# Patient Record
Sex: Female | Born: 1960 | Race: White | Hispanic: No | Marital: Married | Smoking: Never smoker
Health system: Southern US, Community
[De-identification: ages and names within clinical notes are randomized; demographics above are authoritative.]

## PROBLEM LIST (undated history)

## (undated) DIAGNOSIS — R079 Chest pain, unspecified: Secondary | ICD-10-CM

## (undated) DIAGNOSIS — Z91018 Allergy to other foods: Secondary | ICD-10-CM

## (undated) DIAGNOSIS — I341 Nonrheumatic mitral (valve) prolapse: Secondary | ICD-10-CM

## (undated) DIAGNOSIS — I10 Essential (primary) hypertension: Secondary | ICD-10-CM

## (undated) HISTORY — DX: Allergy to other foods: Z91.018

## (undated) HISTORY — DX: Nonrheumatic mitral (valve) prolapse: I34.1

## (undated) HISTORY — DX: Essential (primary) hypertension: I10

---
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
## 1999-09-24 ENCOUNTER — Ambulatory Visit (HOSPITAL_BASED_OUTPATIENT_CLINIC_OR_DEPARTMENT_OTHER): Admission: RE | Admit: 1999-09-24 | Discharge: 1999-09-25 | Payer: Self-pay | Admitting: Orthopaedic Surgery

## 2001-01-13 ENCOUNTER — Ambulatory Visit (HOSPITAL_COMMUNITY): Admission: RE | Admit: 2001-01-13 | Discharge: 2001-01-13 | Payer: Self-pay | Admitting: Obstetrics and Gynecology

## 2001-01-13 ENCOUNTER — Encounter (INDEPENDENT_AMBULATORY_CARE_PROVIDER_SITE_OTHER): Payer: Self-pay | Admitting: Specialist

## 2001-06-06 ENCOUNTER — Inpatient Hospital Stay (HOSPITAL_COMMUNITY): Admission: AD | Admit: 2001-06-06 | Discharge: 2001-06-06 | Payer: Self-pay | Admitting: Obstetrics and Gynecology

## 2001-09-21 ENCOUNTER — Ambulatory Visit (HOSPITAL_COMMUNITY): Admission: RE | Admit: 2001-09-21 | Discharge: 2001-09-21 | Payer: Self-pay | Admitting: Obstetrics and Gynecology

## 2001-11-09 ENCOUNTER — Inpatient Hospital Stay (HOSPITAL_COMMUNITY): Admission: AD | Admit: 2001-11-09 | Discharge: 2001-11-11 | Payer: Self-pay | Admitting: Obstetrics and Gynecology

## 2001-12-14 ENCOUNTER — Other Ambulatory Visit: Admission: RE | Admit: 2001-12-14 | Discharge: 2001-12-14 | Payer: Self-pay | Admitting: *Deleted

## 2004-08-14 ENCOUNTER — Encounter: Admission: RE | Admit: 2004-08-14 | Discharge: 2004-08-14 | Payer: Self-pay | Admitting: Family Medicine

## 2006-04-06 ENCOUNTER — Ambulatory Visit: Admission: RE | Admit: 2006-04-06 | Discharge: 2006-04-06 | Payer: Self-pay | Admitting: Family Medicine

## 2007-06-19 ENCOUNTER — Encounter: Admission: RE | Admit: 2007-06-19 | Discharge: 2007-06-19 | Payer: Self-pay | Admitting: Obstetrics and Gynecology

## 2007-11-16 ENCOUNTER — Encounter: Admission: RE | Admit: 2007-11-16 | Discharge: 2007-11-16 | Payer: Self-pay | Admitting: Obstetrics and Gynecology

## 2009-01-13 ENCOUNTER — Ambulatory Visit (HOSPITAL_COMMUNITY): Admission: RE | Admit: 2009-01-13 | Discharge: 2009-01-13 | Payer: Self-pay | Admitting: Obstetrics and Gynecology

## 2009-01-13 ENCOUNTER — Encounter: Admission: RE | Admit: 2009-01-13 | Discharge: 2009-01-13 | Payer: Self-pay | Admitting: Obstetrics and Gynecology

## 2010-01-19 ENCOUNTER — Encounter: Admission: RE | Admit: 2010-01-19 | Discharge: 2010-01-19 | Payer: Self-pay | Admitting: Obstetrics and Gynecology

## 2010-07-20 ENCOUNTER — Encounter: Payer: Self-pay | Admitting: Obstetrics and Gynecology

## 2010-11-13 NOTE — Op Note (Signed)
[facility]  Patient:    Pamela Poole, Pamela Poole                     MRN: [id number] Proc. Date: 01/13/01 Adm. Date:  [id number] Attending:  Lendon Colonel                           Operative Report  PREOPERATIVE DIAGNOSES:  Infertility and profuse periods.  POSTOPERATIVE DIAGNOSES:  Endometrial polyps and subserosal fibroids.  OPERATION:  Hysteroscopy with resection of endometrial polyps.  Laparoscopy with Choron intubation.  DESCRIPTION OF PROCEDURE:  The patient was placed in lithotomy position and prepped and draped in the usual fashion.  Bladder was emptied.  A transverse incision was made in the umbilicus, and the abdomen was distended with Veress needle under low pressure.  Aspiration infusion technique was utilized.  The trocar was then inserted into the abdomen, and visualization of the pelvis was accomplished.  The Kahn cannula had been placed into the cervix.  The uterus was mobile, quite normal in size.  There were two external serosal fibroids on the left side of the uterus.  Both ovaries were normal.  Indigo carmine was seen to come out of both fallopian tubes quite immediately.  Both fallopian tubes appeared normal in size, and the fimbria likewise appeared normal.  The only finding that was of no significance was there was a high attachment of the sigmoid colon on the left pelvic sidewall.  This did not interfere with tubal position at all.  Both ovaries were normal.  There was no cul-de-sac or anterior evidence of endometriosis.  The upper abdomen appeared normal.  The gas was then evacuated.  The skin closed with 4-0 PDS.  Then the incisions were infiltrated with 0.5% Marcaine with epinephrine.  I did a second puncture down low for manipulation using a 5 mm trocar.  We then went down below and did a careful dilatation of the cervix and visualized the endometrial cavity. The endometrial cavity was smooth except for two small polyps  which were resected without difficulty.  All of the polyps and resected material was sent to the lab for study.  Pamela Poole tolerated this procedure well and was sent to the recovery room in good condition. DD:  01/13/01 TD:  01/13/01 Job: [id number]

## 2010-11-13 NOTE — Op Note (Signed)
[place]. [facility]  Patient:    Pamela Poole, Pamela Poole                       MRN: [id number] Proc. Date: 09/24/99 Adm. Date:  [id number] Attending:  Marcene Corning                           Operative Report  PREOPERATIVE DIAGNOSES: 1. Right knee anterior cruciate ligament tear. 2. Right knee torn medial meniscus.  POSTOPERATIVE DIAGNOSES: 1. Right knee anterior cruciate ligament tear. 2. Right knee torn lateral meniscus.  PROCEDURES: 1. Right knee ACL reconstruction. 2. Right knee partial lateral meniscectomy.  ANESTHESIA:  General.  ATTENDING SURGEON:  Lubertha Basque. Jerl Santos, M.D.  ASSISTANT:  Prince Rome, P.A.  INDICATIONS FOR PROCEDURE:  The patient is a 50 year old woman who injured her nee at work several weeks ago.  Since that time, she has been unable to extend her nee fully and has been left with a painful swollen knee.  Exam was consistent with acute ACL rupture with lack of extension consistent with torn meniscus or interposed ACL stump.  The planned procedure at this point was for operative intervention to consist of partial meniscectomy and an ACL reconstruction with allograft material.  INFORMED CONSENT:  The procedure was discussed with the patient, and informed operative consent was obtained after discussion of the possible complications of, reaction to anesthesia, infection, and knee stiffness.  DESCRIPTION OF PROCEDURE:  The patient was taken to the operating suite, where general anesthetic was applied without difficulty.  She was positioned supine, nd prepped and draped in the normal sterile fashion.  After administration of preoperative IV antibiotics, an arthroscopy of the right knee was performed through two inferior portals.  The suprapatellar pouch was benign, as was the patellofemoral joint.  The medial compartment exhibited no evidence of meniscal or articular cartilage injury.  The notch was notable for a completely  torn ACL with the inferior stump stuck in an anterior position blocking the extension.  The PCL was intact.  The lateral compartment exhibited a small radial tear at the posterior horn of the lateral meniscus.  This was addressed with a partial lateral meniscectomy, taking about 10% of the structure back to a stable rim.  There were no degenerative changes in the lateral compartment.  The torn ACL was removed, followed by an extensive notchplasty done with an arthroscopic bur.  On the back table, Prince Rome crafted an allograft, which was fully defrosted to fit through 9 and 10 mm tunnels.  He placed drill holes in each of the bone plugs, placed PDS sutures in one set of drill holes, and a wire in one of the other drill holes.  A tibial guide was then placed into the knee, and through a separate incision, on the anterior aspect of the tibia, a guide wire was placed, entering the knee just anterior to the PCL.  A reamer was placed over this guide wire to  create a 10 mm tunnel up against the PCL.  A transtibial guide was then placed n the over-the-top position, and a second guide wire was placed through the tibial tunnel using this guide, into the femur, and out the proximal thigh.  Using this, the distal femur was over-reamed to a depth of 30 mm with a 9 mm reamer, leaving a 2 mm posterior wall clearly visualized.  A ______ resector was placed in the knee  to remove bony and cartilaginous debris.  The aforementioned graft was then pulled into position through the tibial tunnel and into the femoral tunnel.  Care was taken to make sure that the tendinous surface of the graft was left facing in a  posterior direction.  The knee was then hyperflexed, followed by placement of a  guide wire through the medial portal in the anterior position in the femoral tunnel.  An 8 x 25 headed interference screw was then placed over this guide wire into the tunnel.  This secured the  femoral end of the graft very well.  The knee was then ranged fully, and the graft was found to tighten a millimeter or two towards full extension.  A second guide wire was placed through the tibial tunnel and seen to enter the knee arthroscopically.  An 8 x 25 headed interference screw was placed ______ .  The fixation was not felt to be adequate, and this was removed, followed by placement of a 9 x 25 interference screw with better bite achieved.  The knee then ranged fully and came to full extension without any impingement.  The graft felt taught.  The knee was thoroughly irrigated followed by removal of instruments.  A simple suture of nylon was placed in the anterior incision, followed by Adaptic on this incision and the portals.  Dry gauze was hen applied, followed by an Ace wrap.  Estimated blood loss and intraoperative fluids can be obtained from anesthesia records.  No tourniquet was inflated.  DISPOSITION:  The patient was extubated in the operating room and taken to the recovery room in stable condition.  The plan was for her to stay overnight with the possibility that she might go home same day.  FOLLOWUP:  She will definitely follow up in the office in less than a week. DD:  09/24/99 TD:  09/24/99 Job: [id number]

## 2010-11-19 ENCOUNTER — Inpatient Hospital Stay (INDEPENDENT_AMBULATORY_CARE_PROVIDER_SITE_OTHER)
Admission: RE | Admit: 2010-11-19 | Discharge: 2010-11-19 | Disposition: A | Discharge status: Home / Self Care | Payer: Worker's Compensation | Source: Ambulatory Visit | Attending: Emergency Medicine | Admitting: Emergency Medicine

## 2010-11-19 DIAGNOSIS — M67919 Unspecified disorder of synovium and tendon, unspecified shoulder: Secondary | ICD-10-CM

## 2010-12-18 ENCOUNTER — Other Ambulatory Visit: Payer: Self-pay | Admitting: Family Medicine

## 2010-12-18 DIAGNOSIS — Z1231 Encounter for screening mammogram for malignant neoplasm of breast: Secondary | ICD-10-CM

## 2011-02-05 ENCOUNTER — Ambulatory Visit
Admission: RE | Admit: 2011-02-05 | Discharge: 2011-02-05 | Disposition: A | Discharge status: Home / Self Care | Payer: BC Managed Care – PPO | Source: Ambulatory Visit | Attending: Family Medicine | Admitting: Family Medicine

## 2011-02-05 DIAGNOSIS — Z1231 Encounter for screening mammogram for malignant neoplasm of breast: Secondary | ICD-10-CM

## 2011-12-21 ENCOUNTER — Other Ambulatory Visit: Payer: Self-pay | Admitting: Family Medicine

## 2011-12-21 ENCOUNTER — Other Ambulatory Visit: Payer: Self-pay | Admitting: Obstetrics and Gynecology

## 2011-12-21 DIAGNOSIS — Z1231 Encounter for screening mammogram for malignant neoplasm of breast: Secondary | ICD-10-CM

## 2012-02-07 ENCOUNTER — Ambulatory Visit: Payer: BC Managed Care – PPO

## 2012-02-08 ENCOUNTER — Ambulatory Visit: Payer: BC Managed Care – PPO

## 2012-02-09 ENCOUNTER — Ambulatory Visit: Payer: BC Managed Care – PPO

## 2012-02-21 ENCOUNTER — Ambulatory Visit
Admission: RE | Admit: 2012-02-21 | Discharge: 2012-02-21 | Disposition: A | Discharge status: Home / Self Care | Payer: BC Managed Care – PPO | Source: Ambulatory Visit | Attending: Obstetrics and Gynecology | Admitting: Obstetrics and Gynecology

## 2012-02-21 DIAGNOSIS — Z1231 Encounter for screening mammogram for malignant neoplasm of breast: Secondary | ICD-10-CM

## 2012-12-19 ENCOUNTER — Other Ambulatory Visit: Payer: Self-pay

## 2012-12-19 DIAGNOSIS — Z1231 Encounter for screening mammogram for malignant neoplasm of breast: Secondary | ICD-10-CM

## 2013-01-16 ENCOUNTER — Ambulatory Visit: Payer: Self-pay

## 2013-01-29 ENCOUNTER — Ambulatory Visit
Admission: RE | Admit: 2013-01-29 | Discharge: 2013-01-29 | Disposition: A | Discharge status: Home / Self Care | Payer: 59 | Source: Ambulatory Visit

## 2013-01-29 DIAGNOSIS — Z1231 Encounter for screening mammogram for malignant neoplasm of breast: Secondary | ICD-10-CM

## 2013-04-25 ENCOUNTER — Encounter: Payer: Self-pay | Admitting: Physician Assistant

## 2013-04-25 ENCOUNTER — Ambulatory Visit (INDEPENDENT_AMBULATORY_CARE_PROVIDER_SITE_OTHER): Payer: 59 | Admitting: Physician Assistant

## 2013-04-25 DIAGNOSIS — T781XXA Other adverse food reactions, not elsewhere classified, initial encounter: Secondary | ICD-10-CM

## 2013-04-25 DIAGNOSIS — H6690 Otitis media, unspecified, unspecified ear: Secondary | ICD-10-CM

## 2013-04-25 DIAGNOSIS — Z91018 Allergy to other foods: Secondary | ICD-10-CM

## 2013-04-25 HISTORY — DX: Allergy to other foods: Z91.018

## 2013-04-25 MED ORDER — EPINEPHRINE 0.3 MG/0.3ML IJ SOAJ: 0.3000 mg | Freq: Once | INTRAMUSCULAR | Status: AC

## 2013-04-25 MED ORDER — AMOXICILLIN-POT CLAVULANATE 875-125 MG PO TABS: 1.0000 | ORAL_TABLET | Freq: Two times a day (BID) | ORAL | Status: DC

## 2013-04-25 NOTE — Progress Notes (Signed)
Patient ID: Pamela Poole MRN: [id number], DOB: [birth year], 52 y.o. Date of Encounter: 04/25/2013, 4:26 PM    Chief Complaint:  Chief Complaint  Patient presents with  . c/o rt ear ache     HPI: 52 y.o. 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 Says that her symptoms started last week with some achiness in her right ear. The pain has gotten much worse over the last 2 or 3 days. As well over the last couple days the left ear is also feeling achy and she has had some mild sore throat. This morning in the shower she blew her nose and felt a popping sensation in her ear. Has not been blowing much from her nose however. Has had no cough or chest congestion. No fevers or chills.  Home Meds: See attached medication section for any medications that were entered at today's visit. The computer does not put those onto this list.The following list is a list of meds entered prior to today's visit.   No current outpatient prescriptions on file prior to visit.   No current facility-administered medications on file prior to visit.    Allergies: No Known Allergies    Review of Systems: See HPI for pertinent ROS. All other ROS negative.    Physical Exam: Blood pressure 128/86, pulse 56, temperature 98.2 F (36.8 C), temperature source Oral, resp. rate 18, weight 175 lb (79.379 kg)., There is no height on file to calculate BMI. General: WNWD WF. Appears in no acute distress. HEENT: Normocephalic, atraumatic, eyes without discharge, sclera non-icteric, nares are without discharge. Bilateral auditory canals clear with no erythema, edema, or pain.  Both of her tympanic membranes are very dull and have an area of red erythema towards the center. Oral cavity moist, posterior pharynx without exudate, erythema, peritonsillar abscess, or post nasal drip.  Neck: Supple. No thyromegaly. No lymphadenopathy. Lungs: Clear bilaterally to auscultation without wheezes,  rales, or rhonchi. Breathing is unlabored. Heart: Regular rhythm. No murmurs, rubs, or gallops. Msk:  Strength and tone normal for age. Extremities/Skin: Warm and dry. No clubbing or cyanosis. No edema. No rashes or suspicious lesions. Neuro: Alert and oriented X 3. Moves all extremities spontaneously. Gait is normal. CNII-XII grossly in tact. Psych:  Responds to questions appropriately with a normal affect.     ASSESSMENT AND PLAN:  52 y.o. year old female with  1. Otitis media, bilateral - amoxicillin-clavulanate (AUGMENTIN) 875-125 MG per tablet; Take 1 tablet by mouth 2 (two) times daily.  Dispense: 20 tablet; Refill: 0 Complete all of the antibiotic. Recommend taking a decongestant. Followup if symptoms do not resolve.  Reviewed effect that she has no known drug allergies she made the comment that she recently has had allergic reaction to apples and cherries. Says that she never had any problems eating these foods until recently. Also reports that she's never had any allergic reaction to any other foods or products. Says that when she eats certain types of apples and when she eats cherries she developed an itchy sensation in her throat. I discussed allergy testing but she defers this at this time. I recommended she have an EpiPen available in case she needs it. Discussed indications of use including swelling and tightness and closing of the throat or difficulty breathing. 2. Food allergy, initial encounter - EPINEPHrine (EPI-PEN) 0.3 mg/0.3 mL SOAJ injection; Inject 0.3 mLs (0.3 mg total) into the muscle once.  Dispense: 1 Device; Refill: 1  [address] [place], [place], [facility] 04/25/2013 4:26 PM

## 2013-07-12 ENCOUNTER — Ambulatory Visit: Payer: 59 | Admitting: Physician Assistant

## 2013-07-16 ENCOUNTER — Ambulatory Visit: Payer: 59 | Admitting: Physician Assistant

## 2013-09-02 ENCOUNTER — Ambulatory Visit (INDEPENDENT_AMBULATORY_CARE_PROVIDER_SITE_OTHER): Payer: 59 | Admitting: Internal Medicine

## 2013-09-02 VITALS — BP 140/80 | HR 64 | Temp 98.6°F | Resp 16 | Ht 71.5 in | Wt 174.0 lb

## 2013-09-02 DIAGNOSIS — J301 Allergic rhinitis due to pollen: Secondary | ICD-10-CM

## 2013-09-02 DIAGNOSIS — J309 Allergic rhinitis, unspecified: Secondary | ICD-10-CM

## 2013-09-02 DIAGNOSIS — J019 Acute sinusitis, unspecified: Secondary | ICD-10-CM

## 2013-09-02 DIAGNOSIS — J329 Chronic sinusitis, unspecified: Secondary | ICD-10-CM

## 2013-09-02 MED ORDER — AMOXICILLIN 500 MG PO CAPS: 1000.0000 mg | ORAL_CAPSULE | Freq: Two times a day (BID) | ORAL | Status: AC

## 2013-09-02 MED ORDER — FLUTICASONE PROPIONATE 50 MCG/ACT NA SUSP: NASAL | Status: DC

## 2013-09-02 NOTE — Progress Notes (Signed)
   Subjective:    Patient ID: Evorn GongSusan D Hilbun, female    DOB: 04/07/1961, 53 y.o.   MRN: [id number] This chart was scribed for Ellamae Siaobert Shlok Raz, MD by Nicholos Johnsenise Iheanachor, Medical Scribe. This patient's care was started at 3:24 PM.  Sinus Problem Associated symptoms include ear pain and sinus pressure.   HPI Comments: Evorn GongSusan D Lukehart is a 53 y.o. female who presents to the [facility] complaining of ear pain, green productive rhinorrhea and sinus pressure that has been intermittent since the beginning of the winter. Will occasionally have swollen, red, itchy eyes. Also states she will wake up in the morning regularly and just feel stopped up.vBeen back and forth to the doctor to treat symptoms but states they always return. Has been treated with a Z-Pak before that helped to resolve symptoms but only temporarily.  Attempts to treat with hot steam showers which provide some relief. Pt states they recently purchased a brand new oak wood stove.  Review of Systems  HENT: Positive for ear pain, rhinorrhea and sinus pressure.   Eyes: Negative for redness and itching.   Objective:  Physical Exam  Vitals reviewed. Constitutional: She is oriented to person, place, and time. She appears well-developed and well-nourished. No distress.  HENT:  Head: Normocephalic and atraumatic.  Right Ear: External ear normal.  Left Ear: External ear normal.  Nose: Nose normal.  Mouth/Throat: Oropharynx is clear and moist.  Nose has purulent discharge.   Eyes: Conjunctivae and EOM are normal. Pupils are equal, round, and reactive to light.  Right tm dark. Left tm clear.   Neck: Neck supple.  Cardiovascular: Normal rate.   Pulmonary/Chest: Effort normal. No respiratory distress.  Musculoskeletal: Normal range of motion.  Lymphadenopathy:    She has no cervical adenopathy.  Neurological: She is alert and oriented to person, place, and time.  Skin: Skin is warm and dry.  Psychiatric: She has a  normal mood and affect. Her behavior is normal.   Assessment & Plan:   I have completed the patient encounter in its entirety as documented by the scribe, with editing by me where necessary. Regan Mcbryar P. Merla Richesoolittle, M.D.  Problem #1 chronic sinus infection secondary to household allergens-suspicious about smoke from wood stove with [place] Meds ordered this encounter  Medications  . amoxicillin (AMOXIL) 500 MG capsule    Sig: Take 2 capsules (1,000 mg total) by mouth 2 (two) times daily.    Dispense:  40 capsule    Refill:  0  . fluticasone (FLONASE) 50 MCG/ACT nasal spray    Sig: 2 sprays each nostril twice a day for the first week and then one spray each nostril twice a day for 3 months    Dispense:  16 g    Refill:  6

## 2013-10-25 ENCOUNTER — Ambulatory Visit (INDEPENDENT_AMBULATORY_CARE_PROVIDER_SITE_OTHER): Payer: 59 | Admitting: Physician Assistant

## 2013-10-25 ENCOUNTER — Encounter: Payer: Self-pay | Admitting: Physician Assistant

## 2013-10-25 ENCOUNTER — Ambulatory Visit
Admission: RE | Admit: 2013-10-25 | Discharge: 2013-10-25 | Disposition: A | Discharge status: Home / Self Care | Payer: 59 | Source: Ambulatory Visit | Attending: Physician Assistant | Admitting: Physician Assistant

## 2013-10-25 VITALS — BP 136/76 | HR 72 | Temp 98.3°F | Resp 18 | Wt 173.0 lb

## 2013-10-25 DIAGNOSIS — R222 Localized swelling, mass and lump, trunk: Secondary | ICD-10-CM

## 2013-10-25 NOTE — Progress Notes (Signed)
    Patient ID: Pamela GongSusan D Kackley MRN: [id number], DOB: 02/21/1961, 53 y.o. Date of Encounter: 10/25/2013, 3:04 PM    Chief Complaint:  Chief Complaint  Patient presents with  . 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 Says that just 5 days ago she noticed that there was a firm area / knot that was protruding from her chest. Says  she knows she would've noticed it before if it had been like that in the past. Says it is not sore or tender or painful.     Home Meds: See attached medication section for any medications that were entered at today's visit. The computer does not put those onto this list.The following list is a list of meds entered prior to today's visit.   Current Outpatient Prescriptions on File Prior to Visit  Medication Sig Dispense Refill  . EPINEPHrine (EPI-PEN) 0.3 mg/0.3 mL SOAJ injection Inject 0.3 mLs (0.3 mg total) into the muscle once.  1 Device  1  . fluticasone (FLONASE) 50 MCG/ACT nasal spray 2 sprays each nostril twice a day for the first week and then one spray each nostril twice a day for 3 months  16 g  6  . MULTIPLE VITAMINS PO Take 1 tablet by mouth daily.       No current facility-administered medications on file prior to visit.    Allergies: No Known Allergies    Review of Systems: See HPI for pertinent ROS. All other ROS negative.    Physical Exam: Blood pressure 136/76, pulse 72, temperature 98.3 F (36.8 C), temperature source Oral, resp. rate 18, weight 173 lb (78.472 kg)., Body mass index is 23.79 kg/(m^2). General:  WNWD WF. Appears in no acute distress. Neck: Supple. No thyromegaly. No lymphadenopathy. Chest:  With inspection of the chest, there is visible protrusion at the right upper chest. There is not a symmetrical protrusion at the same position on the left upper chest. With Palpation: The area of the protrusion is very firm with  palpation.  It seems to be present at the medial edge of the clavicle.  Lungs: Clear bilaterally to auscultation without wheezes, rales, or rhonchi. Breathing is unlabored. Heart: Regular rhythm. No murmurs, rubs, or gallops. Msk:  Strength and tone normal for age. Extremities/Skin: Warm and dry. Neuro: Alert and oriented X 3. Moves all extremities spontaneously. Gait is normal. CNII-XII grossly in tact. Psych:  Responds to questions appropriately with a normal affect.     ASSESSMENT AND PLAN:  53 y.o. year old female with  1. Mass of right chest wall Will start by Obtaining  x-ray to determine whether this protrusion is bone. --Will then determine whether it is part of the clavicle or whether it is abnormal bone growth (ie OsteoSarcoma). Or, if it is not bone at all, then will decide if needs biopsy by surgeon etc.  Obtain chest x-ray results then can make further decisions regarding further evaluation and treatment. - DG Chest 2 View; Future   Signed, Shon HaleMary Beth CohoeDixon, [place], [facility] 10/25/2013 3:04 PM

## 2014-05-03 ENCOUNTER — Telehealth: Payer: Self-pay | Admitting: Physician Assistant

## 2014-05-03 NOTE — Telephone Encounter (Signed)
Patient is calling to see if she could get a referral to an allergist if possible  [phone]

## 2014-05-14 NOTE — Telephone Encounter (Signed)
Pt called insurance and does not need referral.  She has made her own appt.

## 2014-08-29 ENCOUNTER — Encounter: Payer: Self-pay | Admitting: Physician Assistant

## 2014-08-29 ENCOUNTER — Ambulatory Visit (INDEPENDENT_AMBULATORY_CARE_PROVIDER_SITE_OTHER): Payer: PRIVATE HEALTH INSURANCE | Admitting: Physician Assistant

## 2014-08-29 VITALS — BP 114/72 | HR 76 | Temp 100.1°F | Resp 18 | Wt 173.0 lb

## 2014-08-29 DIAGNOSIS — R509 Fever, unspecified: Secondary | ICD-10-CM | POA: Diagnosis not present

## 2014-08-29 DIAGNOSIS — J101 Influenza due to other identified influenza virus with other respiratory manifestations: Secondary | ICD-10-CM

## 2014-08-29 LAB — INFLUENZA A AND B
Inflenza A Ag: POSITIVE — AB
Influenza B Ag: NEGATIVE

## 2014-08-29 MED ORDER — OSELTAMIVIR PHOSPHATE 75 MG PO CAPS: 75.0000 mg | ORAL_CAPSULE | Freq: Two times a day (BID) | ORAL | Status: DC

## 2014-08-29 NOTE — Progress Notes (Signed)
Patient ID: Pamela Poole MRN: [id number], DOB: 05/12/1961, 54 y.o. Date of Encounter: 08/29/2014, 3:33 PM    Chief Complaint:  Chief Complaint  Patient presents with  . sick x 2 days    fever,chills, body aches, congestion     HPI: 54 y.o. year old white female says that she started getting sick Tuesday night 08/27/14. Having some head congestion now it's in her chest as well today. Will some pressure in her ears as well. Says that she has had really bad body aches and chills--" the worst she has ever had".  Temperature here is 100.1 and seizures says that this is after taking aspirin and DayQuil. Teaches third grade. No one in her household is sick.     Home Meds:   Outpatient Prescriptions Prior to Visit  Medication Sig Dispense Refill  . MULTIPLE VITAMINS PO Take 1 tablet by mouth daily.    Marland Kitchen. RETIN-A MICRO 0.04 % gel From Dermatologist    . EPINEPHrine (EPI-PEN) 0.3 mg/0.3 mL SOAJ injection Inject 0.3 mLs (0.3 mg total) into the muscle once. 1 Device 1  . fluticasone (FLONASE) 50 MCG/ACT nasal spray 2 sprays each nostril twice a day for the first week and then one spray each nostril twice a day for 3 months (Patient not taking: Reported on 08/29/2014) 16 g 6   No facility-administered medications prior to visit.    Allergies: No Known Allergies    Review of Systems: See HPI for pertinent ROS. All other ROS negative.    Physical Exam: Blood pressure 114/72, pulse 76, temperature 100.1 F (37.8 C), temperature source Oral, resp. rate 18, weight 173 lb (78.472 kg)., Body mass index is 23.79 kg/(m^2). General:  WNWD WF. Appears in no acute distress. HEENT: Normocephalic, atraumatic, eyes without discharge, sclera non-icteric, nares are without discharge. Bilateral auditory canals clear, TM's are without perforation, pearly grey and translucent with reflective cone of light bilaterally. Oral cavity moist, posterior pharynx without exudate, erythema, peritonsillar abscess. No  tenderness with percussion of frontal or maxillary sinuses bilaterally.  Neck: Supple. No thyromegaly. No lymphadenopathy. Lungs: Clear bilaterally to auscultation without wheezes, rales, or rhonchi. Breathing is unlabored. Heart: Regular rhythm. No murmurs, rubs, or gallops. Msk:  Strength and tone normal for age. Extremities/Skin: Warm and dry.  No rashes. Neuro: Alert and oriented X 3. Moves all extremities spontaneously. Gait is normal. CNII-XII grossly in tact. Psych:  Responds to questions appropriately with a normal affect.   Influenza test is positive for insulin influenza A. I reviewed the test results myself and it is definitely strongly positive.   ASSESSMENT AND PLAN:  54 y.o. year old female with  1. Influenza A Pt states that she did receive influenza vaccine. Instructed patient to start the Tamiflu immediately and take as directed and complete all of it. Can use over-the-counter Tylenol or Motrin as needed for body aches and fever. Also can use over-the-counter decongestants and cough medications as needed for symptom relief for these. At home she lives with her husband and her daughter. Neither of them have any symptoms of the flu so far. They are to take the prophylactic dose of Tamiflu. Patient is to stay out of work tomorrow which is a Friday and then stay home through this weekend so that she is not spreading germs. - oseltamivir (TAMIFLU) 75 MG capsule; Take 1 capsule (75 mg total) by mouth 2 (two) times daily.  Dispense: 10 capsule; Refill: 0  2. Fever and chills - Influenza  a and b   Murray Hodgkins Mullens, [place], [facility] 08/29/2014 3:33 PM

## 2014-11-22 ENCOUNTER — Other Ambulatory Visit: Payer: Self-pay | Admitting: Obstetrics and Gynecology

## 2014-11-22 DIAGNOSIS — R928 Other abnormal and inconclusive findings on diagnostic imaging of breast: Secondary | ICD-10-CM

## 2014-11-29 ENCOUNTER — Ambulatory Visit
Admission: RE | Admit: 2014-11-29 | Discharge: 2014-11-29 | Disposition: A | Discharge status: Home / Self Care | Payer: PRIVATE HEALTH INSURANCE | Source: Ambulatory Visit | Attending: Obstetrics and Gynecology | Admitting: Obstetrics and Gynecology

## 2014-11-29 DIAGNOSIS — R928 Other abnormal and inconclusive findings on diagnostic imaging of breast: Secondary | ICD-10-CM

## 2015-03-18 ENCOUNTER — Encounter: Payer: Self-pay | Admitting: Family Medicine

## 2015-03-18 ENCOUNTER — Ambulatory Visit (INDEPENDENT_AMBULATORY_CARE_PROVIDER_SITE_OTHER): Payer: PRIVATE HEALTH INSURANCE | Admitting: Family Medicine

## 2015-03-18 VITALS — BP 126/68 | HR 78 | Temp 100.2°F | Resp 16 | Wt 172.0 lb

## 2015-03-18 DIAGNOSIS — J019 Acute sinusitis, unspecified: Secondary | ICD-10-CM

## 2015-03-18 MED ORDER — FLUCONAZOLE 150 MG PO TABS: 150.0000 mg | ORAL_TABLET | Freq: Once | ORAL | Status: DC

## 2015-03-18 MED ORDER — AMOXICILLIN-POT CLAVULANATE 875-125 MG PO TABS: 1.0000 | ORAL_TABLET | Freq: Two times a day (BID) | ORAL | Status: DC

## 2015-03-18 NOTE — Progress Notes (Signed)
   Subjective:    Patient ID: Pamela Poole, female    DOB: 12/23/1960, 54 y.o.   MRN: [id number]  HPI  Patient reports 10 days of sinus pain and pressure in her maxillary and frontal sinuses bilaterally. She has had a fever to 101 and 102 all week long. Her teeth hurt. Her head hurts. She has postnasal drip. It all began with a viral upper respiratory infection however it is now turned into a sinus infection which is secondary in nature. She denies any cough. She denies any shortness of breath. She denies any chest pain. She does report significant fatigue Past Medical History  Diagnosis Date  . Multiple food allergies 04/25/2013    Apples, Cherries   No past surgical history on file. Current Outpatient Prescriptions on File Prior to Visit  Medication Sig Dispense Refill  . EPINEPHrine (EPI-PEN) 0.3 mg/0.3 mL SOAJ injection Inject 0.3 mLs (0.3 mg total) into the muscle once. 1 Device 1  . fluticasone (FLONASE) 50 MCG/ACT nasal spray 2 sprays each nostril twice a day for the first week and then one spray each nostril twice a day for 3 months 16 g 6  . MULTIPLE VITAMINS PO Take 1 tablet by mouth daily.    Marland Kitchen RETIN-A MICRO 0.04 % gel From Dermatologist     No current facility-administered medications on file prior to visit.   No Known Allergies Social History   Social History  . Marital Status: Married    Spouse Name: N/A  . Number of Children: N/A  . Years of Education: N/A   Occupational History  . Not on file.   Social History Main Topics  . Smoking status: Never Smoker   . Smokeless tobacco: Never Used  . Alcohol Use: Yes  . Drug Use: No  . Sexual Activity: Not on file   Other Topics Concern  . Not on file   Social History Narrative     Review of Systems  All other systems reviewed and are negative.      Objective:   Physical Exam  Constitutional: She appears well-developed and well-nourished.  HENT:  Right Ear: External ear normal.  Left Ear: External ear  normal.  Nose: Mucosal edema and rhinorrhea present. Right sinus exhibits maxillary sinus tenderness and frontal sinus tenderness. Left sinus exhibits maxillary sinus tenderness and frontal sinus tenderness.  Mouth/Throat: Oropharynx is clear and moist. No oropharyngeal exudate.  Neck: Neck supple.  Cardiovascular: Normal rate, regular rhythm and normal heart sounds.   Pulmonary/Chest: Effort normal and breath sounds normal. No respiratory distress. She has no wheezes. She has no rales.  Lymphadenopathy:    She has no cervical adenopathy.  Vitals reviewed.         Assessment & Plan:  Patient has secondary acute rhinosinusitis. Begin Augmentin 875 mg by mouth twice a day for 10 days. Also recommended the patient take a prednisone taper pack she was recently given at an urgent care. She can also use Diflucan 150 mg by mouth 1 if necessary should she develop a yeast infection

## 2015-08-26 ENCOUNTER — Telehealth: Payer: Self-pay | Admitting: Physician Assistant

## 2015-08-26 NOTE — Telephone Encounter (Signed)
Called pt, voicemail full.

## 2015-08-26 NOTE — Telephone Encounter (Signed)
Patient [place] questions about going out of the country and immunizations  [phone]

## 2015-08-28 NOTE — Telephone Encounter (Signed)
Voice mail box still full, left message on house phone.

## 2015-09-01 NOTE — Telephone Encounter (Signed)
Pt called back.  Given ph # [facility]  [phone].  Told still needs 3rd dose of Twinrix.

## 2015-10-31 ENCOUNTER — Encounter: Payer: Self-pay | Admitting: Internal Medicine

## 2015-11-21 ENCOUNTER — Ambulatory Visit (INDEPENDENT_AMBULATORY_CARE_PROVIDER_SITE_OTHER): Payer: PRIVATE HEALTH INSURANCE | Admitting: Internal Medicine

## 2015-11-21 DIAGNOSIS — Z7184 Encounter for health counseling related to travel: Secondary | ICD-10-CM

## 2015-11-21 DIAGNOSIS — Z7189 Other specified counseling: Secondary | ICD-10-CM

## 2015-11-21 DIAGNOSIS — Z23 Encounter for immunization: Secondary | ICD-10-CM

## 2015-11-21 DIAGNOSIS — Z789 Other specified health status: Secondary | ICD-10-CM

## 2015-11-21 MED ORDER — CIPROFLOXACIN HCL 500 MG PO TABS: 500.0000 mg | ORAL_TABLET | Freq: Two times a day (BID) | ORAL | Status: DC

## 2015-11-21 NOTE — Progress Notes (Signed)
Subjective:   Pamela Poole is a 55 y.o. female who presents to the Infectious Disease clinic for travel consultation. Planned departure date: June 27          Planned return date: July 13 Countries of travel: [place] Areas in country: rural   Accommodations: hostel Purpose of travel: field work Prior travel out of [place]: yes     Objective:   Medications: reviewed    Assessment:    No contraindications to travel. none     Plan:    Issues discussed: environmental concerns, freshwater swimming, future shots, jet lag, MVA safety, safe food/water, traveler's diarrhea, website/handouts for more information, what to do if ill upon return, what to do if ill while there and Yellow Fever. Immunizations recommended: Hepatitis A series, Hepatitis B and Typhoid (parenteral). Last of the series of hepatitis A and B.  Previously received twinrix. Malaria prophylaxis: not indicated Traveler's diarrhea prophylaxis: ciprofloxacin. Total duration of visit: 1 Hour. Total time spent on education, counseling, coordination of care: 30 Minutes.

## 2015-11-21 NOTE — Patient Instructions (Signed)
[facility]                [address], [address]                   [place], [place] [id number]                      Phone: [phone]                        Fax: [phone]   Planned departure date: June 27          Planned return date: July 13 Countries of travel: [place]   Guidelines for the Prevention & Treatment of Traveler's Diarrhea  Prevention: "Boil it, Peel it, [place] it, or Forget it"   the fewer chances -> lower risk: try to stick to food & water precautions as much as possible"   If it's "piping hot"; it is probably okay, if not, it may not be   Treatment   1) You should always take care to drink lots of fluids in order to avoid dehydration   2) You should bring medications with you in case you come down with a case of diarrhea   3) OTC = bring pepto-bismol - can take with initial abdominal symptoms;                    Imodium - can help slow down your intestinal tract, can help relief cramps                    and diarrhea, can take if no bloody diarrhea  Use ciprofloxacin if needed for traveler's diarrhea  Guidelines for the Prevention of Malaria  Avoidance:  -fewer mosquito bites = lower risk. Mosquitos can bite at night as well as daytime  -cover up (long sleeve clothing), mosquito nets, screens  -Insect repellent for your skin ( DEET containing lotion > 20%): for clothes ( permethrin spray)   Malaria prevention not indicated.   Immunizations received today: Hepatitis A series, Hepatitis B and Typhoid (parenteral)  Future immunizations, if indicated none indicated   Prior to travel:  1) Be sure to pick up appropriate prescriptions, including medicine you take daily. Do not expect to be able to fill your prescriptions abroad.  2) Strongly consider obtaining traveler's insurance, including emergency evacuation insurance. Most plans in the [place] do not cover participants abroad. (see below for resources)    3) Register at the appropriate [place] embassy or consulate with travel dates so they are aware of your presence in-country and for helpful advice during travel using the [employer] Traveler Enrollment Program (STEP, GuyGalaxy.sihttps://step.state.gov/step).  4) Leave contact information with a relative or friend.  5) Keep a Corporate treasurerphotocopy passport, credit cards in case they become lost or stolen  6) Inform your credit card company that you will be travelling abroad   During travel:  1) If you become ill and need medical advice, the U.S[place] WellPointembassy website of the country you are traveling in general provides a list of English speaking doctors.  We are also available on MyChart for remote consultation if you register prior to travel. 2) Avoid motorcycles or scooters when at all possible. Traffic laws in many countries are lax and accidents occur frequently.  3) Do not take any unnecessary risks that you wouldn't do at home.   Resources:  -  Country specific information: BlindResource.ca or GreenNylon.com.cy  -Press photographer (DEET, mosquito nets): [employer], [employer] store, [employer], [place] insurance options: gatewayplans.com; http://clayton-rivera.info/; travelguard.com or Good Pilgrim's Pride, gninsurance.com or info@gninsurance .com, [id number].   Post Travel:  If you return from your trip ill, call your primary care doctor or our travel clinic @ [phone].   Enjoy your trip and know that with proper pre-travel preparation, most people have an enjoyable and uninterrupted trip!

## 2015-12-19 ENCOUNTER — Telehealth: Payer: Self-pay | Admitting: *Deleted

## 2015-12-19 NOTE — Telephone Encounter (Signed)
Printed Immunization History for the patient to pick up Monday.  Pt did not need "yellow" immunization card due to not receiving Yellow Fever Vaccine at her Travel Clinic appointment.  Placed information in an envelope and taken to [employer] accordian file.

## 2015-12-19 NOTE — Telephone Encounter (Signed)
Patient called stating she lost her travel immunization record and leaves for a trip Monday evening 12/22/15. She will call back on Monday morning to speak to Micael HampshireMichelle Howell, RN travel nurse. Wendall MolaJacqueline Taejon Irani

## 2016-08-05 ENCOUNTER — Encounter: Payer: Self-pay | Admitting: Family Medicine

## 2016-08-05 ENCOUNTER — Ambulatory Visit: Payer: PRIVATE HEALTH INSURANCE | Admitting: Physician Assistant

## 2016-08-05 ENCOUNTER — Ambulatory Visit (INDEPENDENT_AMBULATORY_CARE_PROVIDER_SITE_OTHER): Payer: Managed Care, Other (non HMO) | Admitting: Family Medicine

## 2016-08-05 VITALS — BP 138/78 | HR 71 | Temp 98.3°F | Ht 69.5 in | Wt 184.0 lb

## 2016-08-05 DIAGNOSIS — Z7689 Persons encountering health services in other specified circumstances: Secondary | ICD-10-CM | POA: Diagnosis not present

## 2016-08-05 DIAGNOSIS — J324 Chronic pansinusitis: Secondary | ICD-10-CM

## 2016-08-05 MED ORDER — AMOXICILLIN-POT CLAVULANATE 875-125 MG PO TABS: 1.0000 | ORAL_TABLET | Freq: Two times a day (BID) | ORAL | 0 refills | Status: DC

## 2016-08-05 NOTE — Patient Instructions (Signed)
It was a pleasure meeting you today!  Your symptoms are most likely related to a viral illness however if symptoms do not improve in 48 to 72 hours, an antibiotic has been provide for you. Please drink plenty of water so that your urine is pale yellow or clear. Also, get plenty of rest, use tylenol or ibuprofen as needed for discomfort and follow up if symptoms do not improve in 3 to 4 days, worsen, or you develop a fever >101.   Sinusitis, Adult Sinusitis is soreness and inflammation of your sinuses. Sinuses are hollow spaces in the bones around your face. They are located:  Around your eyes.  In the middle of your forehead.  Behind your nose.  In your cheekbones. Your sinuses and nasal passages are lined with a stringy fluid (mucus). Mucus normally drains out of your sinuses. When your nasal tissues get inflamed or swollen, the mucus can get trapped or blocked so air cannot flow through your sinuses. This lets bacteria, viruses, and funguses grow, and that leads to infection. Follow these instructions at home: Medicines  Take, use, or apply over-the-counter and prescription medicines only as told by your doctor. These may include nasal sprays.  If you were prescribed an antibiotic medicine, take it as told by your doctor. Do not stop taking the antibiotic even if you start to feel better. Hydrate and Humidify  Drink enough water to keep your pee (urine) clear or pale yellow.  Use a cool mist humidifier to keep the humidity level in your home above 50%.  Breathe in steam for 10-15 minutes, 3-4 times a day or as told by your doctor. You can do this in the bathroom while a hot shower is running.  Try not to spend time in cool or dry air. Rest  Rest as much as possible.  Sleep with your head raised (elevated).  Make sure to get enough sleep each night. General instructions  Put a warm, moist washcloth on your face 3-4 times a day or as told by your doctor. This will help with  discomfort.  Wash your hands often with soap and water. If there is no soap and water, use hand sanitizer.  Do not smoke. Avoid being around people who are smoking (secondhand smoke).  Keep all follow-up visits as told by your doctor. This is important. Contact a doctor if:  You have a fever.  Your symptoms get worse.  Your symptoms do not get better within 10 days. Get help right away if:  You have a very bad headache.  You cannot stop throwing up (vomiting).  You have pain or swelling around your face or eyes.  You have trouble seeing.  You feel confused.  Your neck is stiff.  You have trouble breathing. This information is not intended to replace advice given to you by your health care provider. Make sure you discuss any questions you have with your health care provider. Document Released: 12/01/2007 Document Revised: 02/08/2016 Document Reviewed: 04/09/2015 Elsevier Interactive Patient Education  2017 [employer]

## 2016-08-05 NOTE — Progress Notes (Signed)
Pre visit review using our clinic review tool, if applicable. No additional management support is needed unless otherwise documented below in the visit note. 

## 2016-08-05 NOTE — Progress Notes (Signed)
Patient ID: Pamela Poole, female   DOB: 30-Oct-1960, 56 y.o.   MRN: [id number]  Patient presents to clinic today to establish care.  Acute Concerns:  Symptoms of fatigue, nausea that has improved, chills, myalgias, sneezing, coughing that is nonproductive, nasal congestion for one week. She reports that symptoms are worsening with significant sinus pressure/pain and nasal congestion. Associated rhinitis with clear drainage. She denies fever, sweats, SOB, sore throat, tooth pain, ear pain, and diarrhea. No history of asthma/bronchitis. 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 No recent antibiotic therapy. She is not a smoker but reports smoking in high school. No treatments have been tried at home. No alleviating or aggravating factors.      Health Maintenance: Dental --Twice yearly and is UTD Vision --Yearly Immunizations --Influenza UTD; Td 2012 Colonoscopy --UTD; Normal with 10 year follow up Mammogram --UTD; 6/18 PAP -- UTD; 6/18 Bone Density --Needed at next physical   Past Medical History:  Diagnosis Date  . Multiple food allergies 04/25/2013   Apples, Cherries    No past surgical history on file.  Current Outpatient Prescriptions on File Prior to Visit  Medication Sig Dispense Refill  . EPINEPHrine (EPI-PEN) 0.3 mg/0.3 mL SOAJ injection Inject 0.3 mLs (0.3 mg total) into the muscle once. 1 Device 1  . MULTIPLE VITAMINS PO Take 1 tablet by mouth daily.     No current facility-administered medications on file prior to visit.     No Known Allergies  No family history on file.  Social History   Social History  . Marital status: Married    Spouse name: N/A  . Number of children: N/A  . Years of education: N/A   Occupational History  . Not on file.   Social History Main Topics  . Smoking status: Never Smoker  . Smokeless tobacco: Never Used  . Alcohol use Yes  . Drug use: No  . Sexual activity: Not on file   Other Topics Concern  . Not on file    Social History Narrative  . No narrative on file    Review of Systems  Constitutional: Positive for chills and malaise/fatigue.  HENT: Positive for congestion, sinus pain and sore throat. Negative for ear pain.   Respiratory: Positive for cough. Negative for shortness of breath and wheezing.   Cardiovascular: Negative for chest pain and palpitations.  Musculoskeletal: Positive for myalgias.  Neurological: Negative for dizziness and headaches.    BP 138/78   Pulse 71   Temp 98.3 F (36.8 C)   Ht 5' 9.5" (1.765 m)   Wt 184 lb (83.5 kg)   SpO2 96%   BMI 26.78 kg/m   Physical Exam  Constitutional: She is oriented to person, place, and time and well-developed, well-nourished, and in no distress.  HENT:  Right Ear: Tympanic membrane normal.  Left Ear: Tympanic membrane normal.  Nose: Rhinorrhea present. Right sinus exhibits maxillary sinus tenderness and frontal sinus tenderness. Left sinus exhibits maxillary sinus tenderness and frontal sinus tenderness.  Mouth/Throat: Mucous membranes are normal. No oropharyngeal exudate or posterior oropharyngeal erythema.  Eyes: Pupils are equal, round, and reactive to light. No scleral icterus.  Neck: Neck supple.  Cardiovascular: Normal rate and regular rhythm.   Pulmonary/Chest: Effort normal and breath sounds normal. She has no wheezes.  Lymphadenopathy:    She has no cervical adenopathy.  Neurological: She is alert and oriented to person, place, and time.  Skin: Skin is warm and dry. No rash noted.    Assessment/Plan: 1.  Pansinusitis, unspecified chronicity Symptom duration and worsening symptoms support treatment. We also discussed the possibility that symptoms may be viral and she wanted to hold off on antibiotics for 48 to 72 hours. With duration of symptoms, a written prescription for an antibiotic has been provided. She will monitor symptoms and if no improvement start antibiotic. Advised patient on supportive measures:  Get  rest, drink plenty of fluids, and use tylenol or ibuprofen as needed for pain. Follow up if fever >101, if symptoms worsen or if symptoms are not improved in 3 days. Patient verbalizes understanding.    - amoxicillin-clavulanate (AUGMENTIN) 875-125 MG tablet; Take 1 tablet by mouth 2 (two) times daily.  Dispense: 20 tablet; Refill: 0  2. Encounter to establish care We reviewed the PMH, PSH, FH, SH, Meds and Allergies. -We provided refills for any medications we will prescribe as needed. -We addressed current concerns per orders and patient instructions. -We have asked for records for pertinent exams, studies, vaccines and notes from previous providers. -We have advised patient to follow up per instructions below.   -Patient advised to return or notify a provider immediately if symptoms worsen or persist or new concerns arise.   She will schedule a CPE and lab work in the next 6 months.  Roddie McJulia Julez Huseby, FNP-C

## 2019-01-15 ENCOUNTER — Other Ambulatory Visit: Payer: Self-pay

## 2019-01-15 DIAGNOSIS — Z20822 Contact with and (suspected) exposure to covid-19: Secondary | ICD-10-CM

## 2019-01-18 ENCOUNTER — Telehealth: Payer: Self-pay | Admitting: Hematology

## 2019-01-18 LAB — NOVEL CORONAVIRUS, NAA: SARS-CoV-2, NAA: NOT DETECTED

## 2019-01-18 NOTE — Telephone Encounter (Signed)
Pt is aware covid 19 test is negative °

## 2019-03-30 ENCOUNTER — Other Ambulatory Visit: Payer: Self-pay | Admitting: Obstetrics and Gynecology

## 2019-03-30 DIAGNOSIS — R928 Other abnormal and inconclusive findings on diagnostic imaging of breast: Secondary | ICD-10-CM

## 2019-04-04 ENCOUNTER — Other Ambulatory Visit: Payer: Self-pay

## 2019-04-04 ENCOUNTER — Ambulatory Visit
Admission: RE | Admit: 2019-04-04 | Discharge: 2019-04-04 | Disposition: A | Discharge status: Home / Self Care | Payer: Managed Care, Other (non HMO) | Source: Ambulatory Visit | Attending: Obstetrics and Gynecology | Admitting: Obstetrics and Gynecology

## 2019-04-04 DIAGNOSIS — R928 Other abnormal and inconclusive findings on diagnostic imaging of breast: Secondary | ICD-10-CM

## 2019-07-11 ENCOUNTER — Other Ambulatory Visit: Payer: Self-pay

## 2019-07-11 ENCOUNTER — Encounter: Payer: Self-pay | Admitting: Neurology

## 2019-07-11 ENCOUNTER — Ambulatory Visit: Payer: Managed Care, Other (non HMO) | Admitting: Neurology

## 2019-07-11 VITALS — BP 146/87 | HR 60 | Temp 97.3°F | Ht 71.0 in | Wt 177.3 lb

## 2019-07-11 DIAGNOSIS — R030 Elevated blood-pressure reading, without diagnosis of hypertension: Secondary | ICD-10-CM

## 2019-07-11 DIAGNOSIS — R351 Nocturia: Secondary | ICD-10-CM

## 2019-07-11 DIAGNOSIS — R0683 Snoring: Secondary | ICD-10-CM | POA: Diagnosis not present

## 2019-07-11 DIAGNOSIS — G4719 Other hypersomnia: Secondary | ICD-10-CM

## 2019-07-11 DIAGNOSIS — R519 Headache, unspecified: Secondary | ICD-10-CM

## 2019-07-11 DIAGNOSIS — Z820 Family history of epilepsy and other diseases of the nervous system: Secondary | ICD-10-CM | POA: Diagnosis not present

## 2019-07-11 NOTE — Progress Notes (Signed)
Subjective:    Patient ID: Pamela Poole is a 59 y.o. female.  HPI     Huston Foley, MD, PhD [facility] [address], [address] [address] [place], [place] [address]  Dear Pamela Poole,   I saw your patient, [name], upon your kind request in my sleep clinic today for initial consultation of her sleep disorder, in particular, concern for underlying obstructive sleep apnea.  The patient is unaccompanied today.  As you know, [name] is a 59 year old right-handed woman with an underlying medical history of seasonal and environmental allergies and otherwise benign medical history, who reports snoring for the past 2 years or so. She has had some recent blood pressure elevation, she is keeping a blood pressure log.  She has noted a tendency to have higher blood pressure values first thing in the morning.  She has experienced daytime somnolence in the past year or 2.  She has a family history of sleep apnea affecting her father and brother, her brother is on a CPAP machine, her father had surgery, brother also had surgery.  She herself had nasal septum surgery and turbinate reduction when she was in college and her early 24s.  She reports that her snoring can be loud per husband's report.  She does not wake up gasping for air.  She has woken up with a headache on occasion and does wake up to go to the bathroom once or twice per average night.  Bedtime is generally between 10 and 11, rise time around 5.  She lives with her husband and 23 year old daughter, she works full-time as a Runner, broadcasting/film/video.  She teaches third grade, she has 3 dogs and 1 cat in the household, 1 dog generally tends to be in their bedroom at night, she has a TV in the bedroom and tries to turn it off before falling asleep.  She is a side sleeper.  She has been using a retainer at night, she does not report any bruxism.  She does not have any telltale symptoms of restless leg syndrome or leg twitching at night but has  had leg cramps at night.  She is a non-smoker, smoked some in college, she drinks alcohol about 3 days out of the week in the form of wine, does not drink a significant amount of coffee, usually 1-1/2 cups of half calf coffee, no sodas.  Her Epworth sleepiness score is 10 out of 24, fatigue severity score is 42 out of 63.   Her Past Medical History Is Significant For: Past Medical History:  Diagnosis Date  . High blood pressure   . Multiple food allergies 04/25/2013   Apples, Cherries    Her Past Surgical History Is Significant For: History reviewed. No pertinent surgical history.  Her Family History Is Significant For: History reviewed. No pertinent family history.  Her Social History Is Significant For: Social History   Socioeconomic History  . Marital status: Married    Spouse name: Not on file  . Number of children: Not on file  . Years of education: Not on file  . Highest education level: Not on file  Occupational History  . Not on file  Tobacco Use  . Smoking status: Never Smoker  . Smokeless tobacco: Never Used  Substance and Sexual Activity  . Alcohol use: Yes  . Drug use: No  . Sexual activity: Not on file  Other Topics Concern  . Not on file  Social History Narrative  . Not on file  Social Determinants of Health   Financial Resource Strain:   . Difficulty of Paying Living Expenses: Not on file  Food Insecurity:   . Worried About Charity fundraiser in the Last Year: Not on file  . Ran Out of Food in the Last Year: Not on file  Transportation Needs:   . Lack of Transportation (Medical): Not on file  . Lack of Transportation (Non-Medical): Not on file  Physical Activity:   . Days of Exercise per Week: Not on file  . Minutes of Exercise per Session: Not on file  Stress:   . Feeling of Stress : Not on file  Social Connections:   . Frequency of Communication with Friends and Family: Not on file  . Frequency of Social Gatherings with Friends and Family:  Not on file  . Attends Religious Services: Not on file  . Active Member of Clubs or Organizations: Not on file  . Attends Archivist Meetings: Not on file  . Marital Status: Not on file    Her Allergies Are:  No Known Allergies:   Her Current Medications Are:  Outpatient Encounter Medications as of 07/11/2019  Medication Sig  . EPINEPHrine (EPI-PEN) 0.3 mg/0.3 mL SOAJ injection Inject 0.3 mLs (0.3 mg total) into the muscle once.  . MULTIPLE VITAMINS PO Take 1 tablet by mouth daily.  . [DISCONTINUED] amoxicillin-clavulanate (AUGMENTIN) 875-125 MG tablet Take 1 tablet by mouth 2 (two) times daily.   No facility-administered encounter medications on file as of 07/11/2019.  :  Review of Systems:  Out of a complete 14 point review of systems, all are reviewed and negative with the exception of these symptoms as listed below: Review of Systems  Neurological:       Sleep consult. No prior sleep study. Pt sts she is here to rule out OSA for cause of high BP.  Epworth Sleepiness Scale 0= would never doze 1= slight chance of dozing 2= moderate chance of dozing 3= high chance of dozing  Sitting and reading:2 Watching TV:3 Sitting inactive in a public place (ex. Theater or meeting):1 As a passenger in a car for an hour without a break:2 Lying down to rest in the afternoon:2 Sitting and talking to someone:0 Sitting quietly after lunch (no alcohol):0 In a car, while stopped in traffic:0 Total: 10     Objective:  Neurological Exam  Physical Exam Physical Examination:   Vitals:   07/11/19 1621  BP: (!) 146/87  Pulse: 60  Temp: (!) 97.3 F (36.3 C)    General Examination: The patient is a very pleasant 59 y.o. female in no acute distress. She appears well-developed and well-nourished and well groomed.   HEENT: Normocephalic, atraumatic, pupils are equal, round and reactive to light, extraocular tracking is good without limitation to gaze excursion or nystagmus  noted. Hearing is grossly intact. Face is symmetric with normal facial animation. Speech is clear with no dysarthria noted. There is no hypophonia. There is no lip, neck/head, jaw or voice tremor. Neck is supple with full range of passive and active motion. There are no carotid bruits on auscultation. Oropharynx exam reveals: mild mouth dryness, good dental hygiene and mild airway crowding, due to Small airway entry, Mallampati is class II, tonsils are small, tongue protrudes centrally in palate elevates symmetrically.  Neck circumference is 14 and three-quarter inches.  She has a mild overbite.  Nasal inspection reveals no significant septal deviation or inferior turbinate hypertrophy.  Chest: Clear to auscultation without wheezing, rhonchi  or crackles noted.  Heart: S1+S2+0, regular and normal without murmurs, rubs or gallops noted.   Abdomen: Soft, non-tender and non-distended with normal bowel sounds appreciated on auscultation.  Extremities: There is no pitting edema in the distal lower extremities bilaterally.   Skin: Warm and dry without trophic changes noted.   Musculoskeletal: exam reveals no obvious joint deformities, tenderness or joint swelling or erythema.   Neurologically:  Mental status: The patient is awake, alert and oriented in all 4 spheres. Her immediate and remote memory, attention, language skills and fund of knowledge are appropriate. There is no evidence of aphasia, agnosia, apraxia or anomia. Speech is clear with normal prosody and enunciation. Thought process is linear. Mood is normal and affect is normal.  Cranial nerves II - XII are as described above under HEENT exam.  Motor exam: Normal bulk, strength and tone is noted. There is no tremor, Fine motor skills and coordination: grossly intact.  Cerebellar testing: No dysmetria or intention tremor. There is no truncal or gait ataxia.  Sensory exam: intact to light touch in the upper and lower extremities.  Gait, station  and balance: She stands easily. No veering to one side is noted. No leaning to one side is noted. Posture is age-appropriate and stance is narrow based. Gait shows normal stride length and normal pace. No problems turning are noted.   Assessment and Plan:  In summary, CINNAMON MORENCY is a very pleasant 60 y.o.-year old female with an underlying medical history of seasonal and environmental allergies and otherwise benign medical history, whose history and physical exam are concerning for obstructive sleep apnea (OSA). I had a long chat with the patient about my findings and the diagnosis of OSA, its prognosis and treatment options. We talked about medical treatments, surgical interventions and non-pharmacological approaches. I explained in particular the risks and ramifications of untreated moderate to severe OSA, especially with respect to developing cardiovascular disease down the Road, including congestive heart failure, difficult to treat hypertension, cardiac arrhythmias, or stroke. Even type 2 diabetes has, in part, been linked to untreated OSA. Symptoms of untreated OSA include daytime sleepiness, memory problems, mood irritability and mood disorder such as depression and anxiety, lack of energy, as well as recurrent headaches, especially morning headaches. We talked about trying to maintain a healthy lifestyle in general, as well as the importance of weight control. We also talked about the importance of good sleep hygiene. I recommended the following at this time: sleep study.  I explained the sleep test procedure to the patient and also outlined possible surgical and non-surgical treatment options of OSA, including the use of a custom-made dental device (which would require a referral to a specialist dentist or oral surgeon), upper airway surgical options (which would involve a referral to an ENT surgeon). I also explained the CPAP treatment option to the patient, who indicated that she would be  willing to consider CPAP if the need arises. I explained the importance of being compliant with PAP treatment, not only for insurance purposes but primarily to improve Her symptoms, and for the patient's long term health benefit, including to reduce Her cardiovascular risks. I answered all her questions today and the patient was in agreement. I plan to see her back after the sleep study is completed and encouraged her to call with any interim questions, concerns, problems or updates.   Thank you very much for allowing me to participate in the care of this nice patient. If I can be of  any further assistance to you please do not hesitate to call me at [phone].  Sincerely,   Star Age, MD, PhD

## 2019-07-11 NOTE — Patient Instructions (Signed)
Thank you for choosing [facility] for your sleep related care! It was nice to meet you today! I appreciate that you entrust me with your sleep related healthcare concerns. I hope, I was able to address at least some of your concerns today, and that I can help you feel reassured and also get better.    Here is what we discussed today and what we came up with as our plan for you:    Based on your symptoms and your exam I believe you are at some risk for obstructive sleep apnea (aka OSA), and I think we should proceed with a sleep study to determine whether you do or do not have OSA and how severe it is. Even, if you have mild OSA, I may want you to consider treatment with CPAP, as treatment of even borderline or mild sleep apnea can result and improvement of symptoms such as sleep disruption, daytime sleepiness, nighttime bathroom breaks, restless leg symptoms, improvement of headache syndromes, even improved mood disorder.   Please remember, the long-term risks and ramifications of untreated moderate to severe obstructive sleep apnea are: increased Cardiovascular disease, including congestive heart failure, stroke, difficult to control hypertension, treatment resistant obesity, arrhythmias, especially irregular heartbeat commonly known as A. Fib. (atrial fibrillation); even type 2 diabetes has been linked to untreated OSA.   Sleep apnea can cause disruption of sleep and sleep deprivation in most cases, which, in turn, can cause recurrent headaches, problems with memory, mood, concentration, focus, and vigilance. Most people with untreated sleep apnea report excessive daytime sleepiness, which can affect their ability to drive. Please do not drive if you feel sleepy. Patients with sleep apnea developed difficulty initiating and maintaining sleep (aka insomnia).   Having sleep apnea may increase your risk for other sleep disorders, including involuntary behaviors sleep such as sleep  terrors, sleep talking, sleepwalking.    Having sleep apnea can also increase your risk for restless leg syndrome and leg movements at night.   Please note that untreated obstructive sleep apnea may carry additional perioperative morbidity. Patients with significant obstructive sleep apnea (typically, in the moderate to severe degree) should receive, if possible, perioperative PAP (positive airway pressure) therapy and the surgeons and particularly the anesthesiologists should be informed of the diagnosis and the severity of the sleep disordered breathing.   I will likely see you back after your sleep study to go over the test results and where to go from there. We will call you after your sleep study to advise about the results (most likely, you will hear from [place], my nurse) and to set up an appointment at the time, as necessary.    Our sleep lab administrative assistant will call you to schedule your sleep study and give you further instructions, regarding the check in process for the sleep study, arrival time, what to bring, when you can expect to leave after the study, etc., and to answer any other logistical questions you may have. If you don't hear back from her by about 2 weeks from now, please feel free to call her direct line at [phone] or you can call our general clinic number, or email [place] through My Chart.

## 2019-08-11 ENCOUNTER — Ambulatory Visit: Payer: Managed Care, Other (non HMO)

## 2019-08-23 ENCOUNTER — Ambulatory Visit: Payer: Self-pay

## 2019-08-25 ENCOUNTER — Ambulatory Visit: Payer: Managed Care, Other (non HMO) | Attending: Internal Medicine

## 2019-08-25 DIAGNOSIS — Z23 Encounter for immunization: Secondary | ICD-10-CM

## 2019-08-25 NOTE — Progress Notes (Signed)
   Covid-19 Vaccination Clinic  Name:  Pamela Poole    MRN: [id number] DOB: October 01, 1960  08/25/2019  [name] was observed post Covid-19 immunization for 15 minutes without incidence. She was provided with Vaccine Information Sheet and instruction to access the V-Safe system.   [name] was instructed to call 911 with any severe reactions post vaccine: Marland Kitchen Difficulty breathing  . Swelling of your face and throat  . A fast heartbeat  . A bad rash all over your body  . Dizziness and weakness    Immunizations Administered    Name Date Dose VIS Date Route   Pfizer COVID-19 Vaccine 08/25/2019 10:19 AM 0.3 mL 06/08/2019 Intramuscular   Manufacturer: [employer], [employer]   Lot: [id number]   NDC: [address]-2

## 2019-09-19 ENCOUNTER — Ambulatory Visit: Payer: Managed Care, Other (non HMO) | Attending: Internal Medicine

## 2019-09-19 DIAGNOSIS — Z23 Encounter for immunization: Secondary | ICD-10-CM

## 2019-09-19 NOTE — Progress Notes (Signed)
   Covid-19 Vaccination Clinic  Name:  Pamela Poole    MRN: [id number] DOB: 1960-11-21  09/19/2019  Pamela Poole was observed post Covid-19 immunization for 15 minutes without incident. She was provided with Vaccine Information Sheet and instruction to access the V-Safe system.   Pamela Poole was instructed to call 911 with any severe reactions post vaccine: Marland Kitchen Difficulty breathing  . Swelling of face and throat  . A fast heartbeat  . A bad rash all over body  . Dizziness and weakness   Immunizations Administered    Name Date Dose VIS Date Route   Pfizer COVID-19 Vaccine 09/19/2019 10:25 AM 0.3 mL 06/08/2019 Intramuscular   Manufacturer: [employer], [employer]   Lot: [id number]   NDC: [address]-8

## 2020-04-11 ENCOUNTER — Telehealth: Payer: Self-pay

## 2020-04-11 NOTE — Telephone Encounter (Signed)
Faxed new patient records to [employer] to the attention of [employer]. Patient is scheduled to see Dr. Mayford Knife on 05/08/20

## 2020-04-29 ENCOUNTER — Other Ambulatory Visit: Payer: Self-pay | Admitting: Obstetrics and Gynecology

## 2020-04-29 DIAGNOSIS — R928 Other abnormal and inconclusive findings on diagnostic imaging of breast: Secondary | ICD-10-CM

## 2020-05-08 ENCOUNTER — Encounter: Payer: Self-pay | Admitting: Cardiology

## 2020-05-08 ENCOUNTER — Other Ambulatory Visit: Payer: Self-pay

## 2020-05-08 ENCOUNTER — Ambulatory Visit: Payer: Managed Care, Other (non HMO) | Admitting: Cardiology

## 2020-05-08 VITALS — BP 132/78 | HR 72 | Ht 71.0 in | Wt 177.0 lb

## 2020-05-08 DIAGNOSIS — R072 Precordial pain: Secondary | ICD-10-CM | POA: Diagnosis not present

## 2020-05-08 DIAGNOSIS — I10 Essential (primary) hypertension: Secondary | ICD-10-CM | POA: Diagnosis not present

## 2020-05-08 DIAGNOSIS — R079 Chest pain, unspecified: Secondary | ICD-10-CM | POA: Diagnosis not present

## 2020-05-08 MED ORDER — METOPROLOL TARTRATE 100 MG PO TABS: ORAL_TABLET | ORAL | 0 refills | Status: DC

## 2020-05-08 NOTE — Patient Instructions (Addendum)
Medication Instructions:  Your physician recommends that you continue on your current medications as directed. Please refer to the Current Medication list given to you today.  *If you need a refill on your cardiac medications before your next appointment, please call your pharmacy*  Testing/Procedures: Your physician has requested that you have an echocardiogram. Echocardiography is a painless test that uses sound waves to create images of your heart. It provides your doctor with information about the size and shape of your heart and how well your heart's chambers and valves are working. This procedure takes approximately one hour. There are no restrictions for this procedure.  Your physician has requested that you have a Coronary CTA scan. Please see next page for further instructions.    Follow-Up: At [facility], you and your health needs are our priority.  As part of our continuing mission to provide you with exceptional heart care, we have created designated Provider Care Teams.  These Care Teams include your primary Cardiologist (physician) and Advanced Practice Providers (APPs -  Physician Assistants and Nurse Practitioners) who all work together to provide you with the care you need, when you need it.  Follow up with Dr. Radford Pax as needed based on results of testing.   Other Instructions Your cardiac CT will be scheduled at:   [facility] [address] [place], [place] [address] [phone]  Please arrive at the [facility] main entrance of [facility] 30 minutes prior to test start time. Proceed to the [facility] Radiology Department (first floor) to check-in and test prep.  Please follow these instructions carefully (unless otherwise directed):  On the Night Before the Test: . Be sure to Drink plenty of water. . Do not consume any caffeinated/decaffeinated beverages or chocolate 12 hours prior to your test. . Do not take any antihistamines 12 hours  prior to your test.  On the Day of the Test: . Drink plenty of water. Do not drink any water within one hour of the test. . Do not eat any food 4 hours prior to the test. . You may take your regular medications prior to the test.  . Take metoprolol (Lopressor) two hours prior to test. . HOLD Hydrochlorothiazide morning of the test. . FEMALES- please wear underwire-free bra if available     After the Test: . Drink plenty of water. . After receiving IV contrast, you may experience a mild flushed feeling. This is normal. . On occasion, you may experience a mild rash up to 24 hours after the test. This is not dangerous. If this occurs, you can take Benadryl 25 mg and increase your fluid intake. . If you experience trouble breathing, this can be serious. If it is severe call 911 IMMEDIATELY. If it is mild, please call our office.  Once we have confirmed authorization from your insurance company, we will call you to set up a date and time for your test. Based on how quickly your insurance processes prior authorizations requests, please allow up to 4 weeks to be contacted for scheduling your Cardiac CT appointment. Be advised that routine Cardiac CT appointments could be scheduled as many as 8 weeks after your provider has ordered it.  For non-scheduling related questions, please contact the cardiac imaging nurse navigator should you have any questions/concerns: Marchia Bond, Cardiac Imaging Nurse Navigator Burley Saver, Interim Cardiac Imaging Nurse [place] [facility] Direct Office Dial: [phone]   For scheduling needs, including cancellations and rescheduling, please  call [place], [phone] (temporary number).

## 2020-05-08 NOTE — Progress Notes (Signed)
Cardiology Consult  Note    Date:  05/08/2020   ID:  Pamela Poole, DOB 1961/05/05, MRN [id number]  PCP:  Teena Irani, PA-C  Cardiologist:  Armanda Magic, MD   Chief Complaint  Patient presents with  . New Patient (Initial Visit)    Chest pain    History of Present Illness:  Pamela Poole is a 59 y.o. female who is being seen today for the evaluation of chest pain at the request of Lucila Maine.  This is a 59yo female with a hx of HTN who was referred by her PCP for evaluation of chest discomfort.  She was seen by her PCP in 19-Dec-2022 and complained of intermittent chest discomfort in May that occurred multiple times daily. 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 In Sept she had a reoccurrence of her sx for a few weeks and it resolved.  It usually lasts a minute and then resolves.  There is no radiation of the discomfort and no associated sx of SOB, Nausea or diaphoresis.  She says that it feels like a tightening or stretching and then contracting of her chest.  Her father had CAD dx in his 25's and died in his 94's.  EKG in December 19, 2022 was normal at PCP office.  She occasionally notices a brief fluttering in her chest.  Denies any SOB, PND, orthopnea, LE edema, dizziness or syncope.     Past Medical History:  Diagnosis Date  . High blood pressure   . Multiple food allergies 04/25/2013   Apples, Cherries    No past surgical history on file.  Current Medications: Current Meds  Medication Sig  . EPINEPHrine (EPI-PEN) 0.3 mg/0.3 mL SOAJ injection Inject 0.3 mLs (0.3 mg total) into the muscle once.  . hydrochlorothiazide (MICROZIDE) 12.5 MG capsule Take 12.5 mg by mouth daily.  . MULTIPLE VITAMINS PO Take 1 tablet by mouth daily.    Allergies:   Patient has no known allergies.   Social History   Socioeconomic History  . Marital status: Married    Spouse name: Not on file  . Number of children: Not on file  . Years of education: Not on file  .  Highest education level: Not on file  Occupational History  . Not on file  Tobacco Use  . Smoking status: Never Smoker  . Smokeless tobacco: Never Used  Substance and Sexual Activity  . Alcohol use: Yes  . Drug use: No  . Sexual activity: Not on file  Other Topics Concern  . Not on file  Social History Narrative  . Not on file   Social Determinants of Health   Financial Resource Strain:   . Difficulty of Paying Living Expenses: Not on file  Food Insecurity:   . Worried About Programme researcher, broadcasting/film/video in the Last Year: Not on file  . Ran Out of Food in the Last Year: Not on file  Transportation Needs:   . Lack of Transportation (Medical): Not on file  . Lack of Transportation (Non-Medical): Not on file  Physical Activity:   . Days of Exercise per Week: Not on file  . Minutes of Exercise per Session: Not on file  Stress:   . Feeling of Stress : Not on file  Social Connections:   . Frequency of Communication with Friends and Family: Not on file  . Frequency of Social Gatherings with Friends and Family: Not on file  . Attends Religious Services: Not on  file  . Active Member of Clubs or Organizations: Not on file  . Attends Banker Meetings: Not on file  . Marital Status: Not on file     Family History:  The patient's family history is not on file.   ROS:   Please see the history of present illness.    ROS All other systems reviewed and are negative.  No flowsheet data found.   PHYSICAL EXAM:   VS:  BP 132/78   Pulse 72   Ht 5\' 11"  (1.803 m)   Wt 177 lb (80.3 kg)   SpO2 97%   BMI 24.69 kg/m    GEN: Well nourished, well developed, in no acute distress  HEENT: normal  Neck: no JVD, carotid bruits, or masses Cardiac: RRR; no murmurs, rubs, or gallops,no edema.  Intact distal pulses bilaterally.  Respiratory:  clear to auscultation bilaterally, normal work of breathing GI: soft, nontender, nondistended, + BS MS: no deformity or atrophy  Skin: warm and dry,  no rash Neuro:  Alert and Oriented x 3, Strength and sensation are intact Psych: euthymic mood, full affect  Wt Readings from Last 3 Encounters:  05/08/20 177 lb (80.3 kg)  07/11/19 177 lb 5 oz (80.4 kg)  08/05/16 184 lb (83.5 kg)      Studies/Labs Reviewed:   EKG:  EKG is ordered today.  The ekg ordered today demonstrates NSR with nonspecific ST abnormality  Recent Labs: No results found for requested labs within last 8760 hours.   Lipid Panel No results found for: CHOL, TRIG, HDL, CHOLHDL, VLDL, LDLCALC, LDLDIRECT    Additional studies/ records that were reviewed today include:  OV notes from PCP    ASSESSMENT:    1. Chest pain of uncertain etiology   2. Benign essential HTN      PLAN:  In order of problems listed above:  1. Chest pain  -her pain is atypical and is not associated with any other sx -she has a fm hx of CAD but at a later age in life -she has never smoked -EKG done today looks a little different compared to June with nonspecific ST abnormality which may be due to HTN -I will get a coronary CTA to assess coronary anatomy -check 2D echo to assess for LVH  2.  HTN -BP controlled -continue HCTZ 12.5mg  daily    Medication Adjustments/Labs and Tests Ordered: Current medicines are reviewed at length with the patient today.  Concerns regarding medicines are outlined above.  Medication changes, Labs and Tests ordered today are listed in the Patient Instructions below.  There are no Patient Instructions on file for this visit.   Signed, July, MD  05/08/2020 2:11 PM    [facility] [address] [place], [place], [place]  [place] Phone: [phone]; Fax: [phone]

## 2020-05-14 ENCOUNTER — Ambulatory Visit
Admission: RE | Admit: 2020-05-14 | Discharge: 2020-05-14 | Disposition: A | Discharge status: Home / Self Care | Payer: Managed Care, Other (non HMO) | Source: Ambulatory Visit | Attending: Obstetrics and Gynecology | Admitting: Obstetrics and Gynecology

## 2020-05-14 ENCOUNTER — Other Ambulatory Visit: Payer: Self-pay

## 2020-05-14 DIAGNOSIS — R928 Other abnormal and inconclusive findings on diagnostic imaging of breast: Secondary | ICD-10-CM

## 2020-05-28 ENCOUNTER — Telehealth: Payer: Self-pay

## 2020-05-28 NOTE — Telephone Encounter (Signed)
-----   Message from Lorrin Jackson sent at 05/28/2020 11:55 AM EST ----- Regarding: ct heart I spoke with Ms. Dhaliwal trying to schedule her ct heart she want to speak to someone to cancel her ECHO scan on 06/03/20 also to speak to MD Turner to see if its another way to go about looking at her heart. 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   Thanks, [place]

## 2020-05-28 NOTE — Telephone Encounter (Signed)
Spoke with the patient and rescheduled her echocardiogram. Patient states that she was a former smoker many years ago and also had some radon exposure from a house that she lived in. She states that she has been seeing commercials for a test that is being recommended to anyone that has been exposed to cigarette smoke and is wondering if that is something she should have done rather than the Coronary CTA scan that was ordered. I advised her that I was unaware of the test that was being advertised and explained to her the reasoning behind the CT scan and what is can show. Patient is agreeable to go through with coronary CTA but would like to wait and have it done in January due to insurance.

## 2020-06-03 ENCOUNTER — Other Ambulatory Visit (HOSPITAL_COMMUNITY): Payer: Managed Care, Other (non HMO)

## 2020-07-08 ENCOUNTER — Encounter: Payer: Self-pay | Admitting: Cardiology

## 2020-07-08 ENCOUNTER — Ambulatory Visit (HOSPITAL_COMMUNITY): Payer: Managed Care, Other (non HMO) | Attending: Cardiovascular Disease

## 2020-07-08 ENCOUNTER — Other Ambulatory Visit: Payer: Self-pay

## 2020-07-08 DIAGNOSIS — R079 Chest pain, unspecified: Secondary | ICD-10-CM | POA: Diagnosis not present

## 2020-07-08 DIAGNOSIS — I341 Nonrheumatic mitral (valve) prolapse: Secondary | ICD-10-CM | POA: Insufficient documentation

## 2020-07-08 LAB — ECHOCARDIOGRAM COMPLETE
Area-P 1/2: 3.37 cm2
S' Lateral: 2.7 cm

## 2020-07-09 ENCOUNTER — Telehealth: Payer: Self-pay | Admitting: Cardiology

## 2020-07-09 NOTE — Telephone Encounter (Signed)
     Pt is returning Pamela Poole's call for her echo result, she said to call her tomorrow around 11:15 to 11:30 am or 1:15 to 1:30 pm or After 3:45 pm.

## 2020-07-10 NOTE — Telephone Encounter (Signed)
The patient has been notified of the result and verbalized understanding.  All questions (if any) were answered. Theresia Majors, RN 07/10/2020 11:25 AM

## 2020-08-18 ENCOUNTER — Ambulatory Visit (HOSPITAL_COMMUNITY): Payer: Managed Care, Other (non HMO)

## 2021-01-05 ENCOUNTER — Telehealth (HOSPITAL_COMMUNITY): Payer: Self-pay | Admitting: Emergency Medicine

## 2021-01-05 DIAGNOSIS — R079 Chest pain, unspecified: Secondary | ICD-10-CM

## 2021-01-05 MED ORDER — METOPROLOL TARTRATE 100 MG PO TABS: ORAL_TABLET | ORAL | 0 refills | Status: DC

## 2021-01-05 NOTE — Telephone Encounter (Signed)
Reaching out to patient to offer assistance regarding upcoming cardiac imaging study; pt verbalizes understanding of appt date/time, parking situation and where to check in, pre-test NPO status and medications ordered, and verified current allergies; name and call back number provided for further questions should they arise Rockwell Alexandria RN Navigator Cardiac Imaging [facility] [phone] office [phone] cell  Denies IV issues Denies claustro  Represcribed metoprolol since she failed to pick it up in November 2021.  [name]

## 2021-01-07 ENCOUNTER — Encounter (HOSPITAL_COMMUNITY): Payer: Self-pay

## 2021-01-07 ENCOUNTER — Other Ambulatory Visit: Payer: Self-pay

## 2021-01-07 ENCOUNTER — Ambulatory Visit (HOSPITAL_COMMUNITY)
Admission: RE | Admit: 2021-01-07 | Discharge: 2021-01-07 | Disposition: A | Discharge status: Home / Self Care | Payer: Managed Care, Other (non HMO) | Source: Ambulatory Visit | Attending: Cardiology | Admitting: Cardiology

## 2021-01-07 DIAGNOSIS — R072 Precordial pain: Secondary | ICD-10-CM | POA: Diagnosis present

## 2021-01-07 MED ORDER — IOHEXOL 350 MG/ML SOLN
95.0000 mL | Freq: Once | INTRAVENOUS | Status: AC | PRN
  Administered 2021-01-07: 95 mL via INTRAVENOUS

## 2021-01-07 MED ORDER — NITROGLYCERIN 0.4 MG SL SUBL
0.8000 mg | SUBLINGUAL_TABLET | Freq: Once | SUBLINGUAL | Status: AC
  Administered 2021-01-07: 0.8 mg via SUBLINGUAL

## 2021-01-07 MED ORDER — NITROGLYCERIN 0.4 MG SL SUBL
SUBLINGUAL_TABLET | SUBLINGUAL | Status: AC
  Filled 2021-01-07: qty 1

## 2021-02-03 ENCOUNTER — Telehealth: Payer: Self-pay | Admitting: Neurology

## 2021-02-03 NOTE — Telephone Encounter (Signed)
Patient called the sleep lab and left a voice mail stating she wants to restart the process to get her sleep study done. Last appointment date was 07/11/19 the order has expired. Please reach out to patient to schedule with MD or NP.

## 2021-04-13 ENCOUNTER — Ambulatory Visit: Payer: Managed Care, Other (non HMO) | Admitting: Neurology

## 2021-05-28 ENCOUNTER — Encounter: Payer: Self-pay | Admitting: Neurology

## 2021-05-28 ENCOUNTER — Ambulatory Visit: Payer: Managed Care, Other (non HMO) | Admitting: Neurology

## 2021-05-28 VITALS — BP 126/75 | HR 61 | Ht 71.0 in | Wt 175.6 lb

## 2021-05-28 DIAGNOSIS — R0683 Snoring: Secondary | ICD-10-CM

## 2021-05-28 DIAGNOSIS — R030 Elevated blood-pressure reading, without diagnosis of hypertension: Secondary | ICD-10-CM | POA: Diagnosis not present

## 2021-05-28 DIAGNOSIS — R519 Headache, unspecified: Secondary | ICD-10-CM

## 2021-05-28 DIAGNOSIS — R351 Nocturia: Secondary | ICD-10-CM

## 2021-05-28 DIAGNOSIS — G4719 Other hypersomnia: Secondary | ICD-10-CM | POA: Diagnosis not present

## 2021-05-28 DIAGNOSIS — Z820 Family history of epilepsy and other diseases of the nervous system: Secondary | ICD-10-CM

## 2021-05-28 NOTE — Progress Notes (Signed)
Subjective:    Patient ID: Pamela Poole is a 60 y.o. female.  HPI    Interim history:   [name] is a 60 year old right-handed woman with an underlying medical history of seasonal and environmental allergies, HTN and MVP, who presents for reevaluation of her sleep disturbance. I had evaluated her for concern for sleep apnea in January 2021.  She was advised to proceed with sleep testing.  She did not pursue it at the time, due to insurance restriction.  Today, 05/28/2021: She reports worsening snoring and daytime somnolence.  Her Epworth sleepiness score is 8 out of 24, fatigue severity score is 31/63.  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  She tries to sleep longer on the weekends and in the summertime when school is out she can sleep a little longer.  She drinks caffeine in the form of coffee, 1 or 2 cups in the mornings.  Alcohol occasionally, typically 3-4 times per week.  She is in non-smoker.  Has noticed T2 immunity.  They have a small dog in the household, the dog does not sleep on the bed with them.  She has a family history of sleep apnea affecting 2 brothers and her dad who passed away.  She would like to pursue sleep testing at this time.  Previously:   07/11/19: (She) reports snoring for the past 2 years or so. She has had some recent blood pressure elevation, she is keeping a blood pressure log.  She has noted a tendency to have higher blood pressure values first thing in the morning.  She has experienced daytime somnolence in the past year or 2.  She has a family history of sleep apnea affecting her father and brother, her brother is on a CPAP machine, her father had surgery, brother also had surgery.  She herself had nasal septum surgery and turbinate reduction when she was in college and her early 65s.  She reports that her snoring can be loud per husband's report.  She does not wake up gasping for air.  She  has woken up with a headache on occasion and does wake up to go to the bathroom once or twice per average night.  Bedtime is generally between 10 and 11, rise time around 5.  She lives with her husband and 76 year old daughter, she works full-time as a Runner, broadcasting/film/video.  She teaches third grade, she has 3 dogs and 1 cat in the household, 1 dog generally tends to be in their bedroom at night, she has a TV in the bedroom and tries to turn it off before falling asleep.  She is a side sleeper.  She has been using a retainer at night, she does not report any bruxism.  She does not have any telltale symptoms of restless leg syndrome or leg twitching at night but has had leg cramps at night.  She is a non-smoker, smoked some in college, she drinks alcohol about 3 days out of the week in the form of wine, does not drink a significant amount of coffee, usually 1-1/2 cups of half calf coffee, no sodas.  Her Epworth sleepiness score is 10 out of 24, fatigue severity score is 42 out of 63.  Her Past Medical History Is Significant For: Past Medical History:  Diagnosis Date   High blood pressure    Multiple food allergies 04/25/2013   Apples, Cherries   MVP (mitral valve prolapse)  Mild late systolic prolapse of the posterior miltral valve leaflet with late systolic MR that is mild. The mitral valve is myxomatous. Mild mitral valve regurgitation. There is mild late systolic prolapse of the medial segment of the PMVL by echo 06/2020    Her Past Surgical History Is Significant For: History reviewed. No pertinent surgical history.  Her Family History Is Significant For: Family History  Problem Relation Age of Onset   Sleep apnea Father    Sleep apnea Brother    Sleep apnea Brother     Her Social History Is Significant For: Social History   Socioeconomic History   Marital status: Married    Spouse name: Not on file   Number of children: Not on file   Years of education: Not on file   Highest education level:  Not on file  Occupational History   Not on file  Tobacco Use   Smoking status: Never   Smokeless tobacco: Never  Substance and Sexual Activity   Alcohol use: Yes    Comment: OCC   Drug use: No   Sexual activity: Not on file  Other Topics Concern   Not on file  Social History Narrative   Not on file   Social Determinants of Health   Financial Resource Strain: Not on file  Food Insecurity: Not on file  Transportation Needs: Not on file  Physical Activity: Not on file  Stress: Not on file  Social Connections: Not on file    Her Allergies Are:  No Known Allergies:   Her Current Medications Are:  Outpatient Encounter Medications as of 05/28/2021  Medication Sig   EPINEPHrine (EPI-PEN) 0.3 mg/0.3 mL SOAJ injection Inject 0.3 mLs (0.3 mg total) into the muscle once.   hydrochlorothiazide (MICROZIDE) 12.5 MG capsule Take 12.5 mg by mouth daily.   metoprolol tartrate (LOPRESSOR) 100 MG tablet Take 1 tablet (100 mg) two hours prior to CT scan.   MULTIPLE VITAMINS PO Take 1 tablet by mouth daily.   No facility-administered encounter medications on file as of 05/28/2021.  :  Review of Systems:  Out of a complete 14 point review of systems, all are reviewed and negative with the exception of these symptoms as listed below:  Review of Systems  Neurological:        Pt is here for sleep consult . Pt states she was suppose to do sleep study last year. Pt states her her kickback from insurance was going to be too expensive. Pt states her insurance is a lot better and she is ready to do sleep study this month. Pt states she does snore,has headaches in am , has fatigue throughout the day, and hypertension   ESS:8 FSS: 31   Objective:  Neurological Exam  Physical Exam Physical Examination:   Vitals:   05/28/21 1000  BP: 126/75  Pulse: 61    General Examination: The patient is a very pleasant 60 y.o. female in no acute distress. She appears well-developed and well-nourished and  well groomed.   HEENT: Normocephalic, atraumatic, pupils are equal, round and reactive to light, extraocular tracking is good without limitation to gaze excursion or nystagmus noted. Hearing is grossly intact. Face is symmetric with normal facial animation. Speech is clear with no dysarthria, hypophonia or voice tremor. Neck is supple with full range of passive and active motion. There are no carotid bruits on auscultation. Oropharynx exam reveals: mild mouth dryness, good dental hygiene and mild airway crowding. Mallampati is class II, tonsils are small,  tongue protrudes centrally and palate elevates symmetrically.  Neck circumference is 14 1/4 inches.  She has a mild overbite.     Chest: Clear to auscultation without wheezing, rhonchi or crackles noted.   Heart: S1+S2+0, regular and normal without murmurs, rubs or gallops noted.    Abdomen: Soft, non-tender and non-distended.   Extremities: There is no pitting edema in the distal lower extremities bilaterally.    Skin: Warm and dry without trophic changes noted.    Musculoskeletal: exam reveals no obvious joint deformities.    Neurologically:  Mental status: The patient is awake, alert and oriented in all 4 spheres. Her immediate and remote memory, attention, language skills and fund of knowledge are appropriate. There is no evidence of aphasia, agnosia, apraxia or anomia. Speech is clear with normal prosody and enunciation. Thought process is linear. Mood is normal and affect is normal.  Cranial nerves II - XII are as described above under HEENT exam.  Motor exam: Normal bulk, strength and tone is noted. There is no tremor, fine motor skills and coordination: grossly intact.  Cerebellar testing: No dysmetria or intention tremor. There is no truncal or gait ataxia.  Sensory exam: intact to light touch in the upper and lower extremities.  Gait, station and balance: She stands easily. No veering to one side is noted. No leaning to one side is  noted. Posture is age-appropriate and stance is narrow based. Gait shows normal stride length and normal pace. No problems turning are noted.    Assessment and Plan:  In summary, ADMIRE BUNNELL is a very pleasant 60 year old female with an underlying medical history of seasonal and environmental allergies, HTN and MVP, who presents for reevaluation of her sleep disturbance.  Her history and examination and family history are concerning for underlying obstructive sleep apnea.   I we again talked about the diagnosis of OSA, its prognosis and treatment options. We talked about medical treatments, surgical interventions and non-pharmacological approaches. I explained in particular the risks and ramifications of untreated moderate to severe OSA, especially with respect to developing cardiovascular disease down the Road, including congestive heart failure, difficult to treat hypertension, cardiac arrhythmias, or stroke. Even type 2 diabetes has, in part, been linked to untreated OSA. Symptoms of untreated OSA include daytime sleepiness, memory problems, mood irritability and mood disorder such as depression and anxiety, lack of energy, as well as recurrent headaches, especially morning headaches. We talked about trying to maintain a healthy lifestyle in general, as well as the importance of weight control. We also talked about the importance of good sleep hygiene. I recommended the following at this time: sleep study.  She would prefer a home sleep test. I explained the sleep test procedure to the patient and also outlined possible surgical and non-surgical treatment options of OSA, including the use of a custom-made dental device (which would require a referral to a specialist dentist or oral surgeon), upper airway surgical options (which would involve a referral to an ENT surgeon). I also explained the CPAP treatment option to the patient, who indicated that she would be willing to consider PAP if the need  arises. I explained the importance of being compliant with PAP treatment, not only for insurance purposes but primarily to improve Her symptoms, and for the patient's long term health benefit, including to reduce Her cardiovascular risks.  We will pick up our discussion about treatment options after testing.  We will keep her posted as to her test results by phone  call and plan a follow-up in this clinic accordingly. I answered all her questions today and the patient was in agreement.  I spent 30 minutes in total face-to-face time and in reviewing records during pre-charting, more than 50% of which was spent in counseling and coordination of care, reviewing test results, reviewing medications and treatment regimen and/or in discussing or reviewing the diagnosis of OSA, the prognosis and treatment options. Pertinent laboratory and imaging test results that were available during this visit with the patient were reviewed by me and considered in my medical decision making (see chart for details).

## 2021-06-10 ENCOUNTER — Ambulatory Visit (INDEPENDENT_AMBULATORY_CARE_PROVIDER_SITE_OTHER): Payer: Managed Care, Other (non HMO) | Admitting: Neurology

## 2021-06-10 DIAGNOSIS — R351 Nocturia: Secondary | ICD-10-CM

## 2021-06-10 DIAGNOSIS — Z820 Family history of epilepsy and other diseases of the nervous system: Secondary | ICD-10-CM

## 2021-06-10 DIAGNOSIS — R030 Elevated blood-pressure reading, without diagnosis of hypertension: Secondary | ICD-10-CM

## 2021-06-10 DIAGNOSIS — R519 Headache, unspecified: Secondary | ICD-10-CM

## 2021-06-10 DIAGNOSIS — G4733 Obstructive sleep apnea (adult) (pediatric): Secondary | ICD-10-CM | POA: Diagnosis not present

## 2021-06-10 DIAGNOSIS — G4719 Other hypersomnia: Secondary | ICD-10-CM

## 2021-06-10 DIAGNOSIS — R0683 Snoring: Secondary | ICD-10-CM

## 2021-06-12 NOTE — Progress Notes (Signed)
° °  [facility]  HOME SLEEP TEST (Watch PAT) REPORT  STUDY DATE: 06/10/2021  DOB: 05/25/1961  MRN: [id number]  ORDERING CLINICIAN: Huston Foley, MD, PhD   REFERRING CLINICIAN: Teena Irani, PA-C   CLINICAL INFORMATION/HISTORY: 60 year old right-handed woman with an underlying medical history of seasonal and environmental allergies, HTN and MVP, who presents for evaluation of her sleep disturbance. She reports worsening snoring and daytime somnolence.   Epworth sleepiness score: 8/24.  BMI: 24.4 kg/m  FINDINGS:   Sleep Summary:   Total Recording Time (hours, min): 7 hours, 5 minutes  Total Sleep Time (hours, min):  6 hours, 7 minutes   Percent REM (%):    29%   Respiratory Indices:   Calculated pAHI (per hour):  12.6/hour         REM pAHI:    27.3/hour       NREM pAHI: 6.7/hour  Oxygen Saturation Statistics:    Oxygen Saturation (%) Mean: 95%   Minimum oxygen saturation (%):                 83%   O2 Saturation Range (%): 83-99%    O2 Saturation (minutes) <=88%: 0.2 min  Pulse Rate Statistics:   Pulse Mean (bpm):    67/min    Pulse Range (54-99/min)   IMPRESSION: OSA (obstructive sleep apnea) , mild  RECOMMENDATION:  This home sleep test demonstrates overall mild obstructive sleep apnea with a total AHI of 12.6/hour and O2 nadir of 83%.  Intermittent mild to moderate snoring was detected.  Given the patient's medical history and sleep related complaints, treatment with positive airway pressure is recommended. This can be achieved in the form of autoPAP trial/titration at home. A full night CPAP titration study will likely help with proper treatment settings and mask fitting if needed, down the road. Alternative treatments may include an oral appliance through dentistry.  Surgical treatment is typically not recommended for mild sleep apnea.  Please note that untreated obstructive sleep apnea may carry additional perioperative morbidity.  Patients with significant obstructive sleep apnea should receive perioperative PAP therapy and the surgeons and particularly the anesthesiologist should be informed of the diagnosis and the severity of the sleep disordered breathing. The patient should be cautioned not to drive, work at heights, or operate dangerous or heavy equipment when tired or sleepy. Review and reiteration of good sleep hygiene measures should be pursued with any patient. Other causes of the patient's symptoms, including circadian rhythm disturbances, an underlying mood disorder, medication effect and/or an underlying medical problem cannot be ruled out based on this test. Clinical correlation is recommended.   The patient and her referring provider will be notified of the test results. The patient will be seen in follow up in sleep clinic at [facility].  I certify that I have reviewed the raw data recording prior to the issuance of this report in accordance with the standards of the American Academy of Sleep Medicine (AASM).  INTERPRETING PHYSICIAN:   Huston Foley, MD, PhD  Board Certified in Neurology and Sleep Medicine  [facility] [address] [place], [place] [address] [phone]

## 2021-06-12 NOTE — Addendum Note (Signed)
Addended by: Huston Foley on: 06/12/2021 12:43 PM   Modules accepted: Orders

## 2021-06-12 NOTE — Procedures (Signed)
° °  [facility]  HOME SLEEP TEST (Watch PAT) REPORT  STUDY DATE: 06/10/2021  DOB: 05/25/1961  MRN: [id number]  ORDERING CLINICIAN: Huston Foley, MD, PhD   REFERRING CLINICIAN: Teena Irani, PA-C   CLINICAL INFORMATION/HISTORY: 60 year old right-handed woman with an underlying medical history of seasonal and environmental allergies, HTN and MVP, who presents for evaluation of her sleep disturbance. She reports worsening snoring and daytime somnolence.   Epworth sleepiness score: 8/24.  BMI: 24.4 kg/m  FINDINGS:   Sleep Summary:   Total Recording Time (hours, min): 7 hours, 5 minutes  Total Sleep Time (hours, min):  6 hours, 7 minutes   Percent REM (%):    29%   Respiratory Indices:   Calculated pAHI (per hour):  12.6/hour         REM pAHI:    27.3/hour       NREM pAHI: 6.7/hour  Oxygen Saturation Statistics:    Oxygen Saturation (%) Mean: 95%   Minimum oxygen saturation (%):                 83%   O2 Saturation Range (%): 83-99%    O2 Saturation (minutes) <=88%: 0.2 min  Pulse Rate Statistics:   Pulse Mean (bpm):    67/min    Pulse Range (54-99/min)   IMPRESSION: OSA (obstructive sleep apnea) , mild  RECOMMENDATION:  This home sleep test demonstrates overall mild obstructive sleep apnea with a total AHI of 12.6/hour and O2 nadir of 83%.  Intermittent mild to moderate snoring was detected.  Given the patient's medical history and sleep related complaints, treatment with positive airway pressure is recommended. This can be achieved in the form of autoPAP trial/titration at home. A full night CPAP titration study will likely help with proper treatment settings and mask fitting if needed, down the road. Alternative treatments may include an oral appliance through dentistry.  Surgical treatment is typically not recommended for mild sleep apnea.  Please note that untreated obstructive sleep apnea may carry additional perioperative morbidity.  Patients with significant obstructive sleep apnea should receive perioperative PAP therapy and the surgeons and particularly the anesthesiologist should be informed of the diagnosis and the severity of the sleep disordered breathing. The patient should be cautioned not to drive, work at heights, or operate dangerous or heavy equipment when tired or sleepy. Review and reiteration of good sleep hygiene measures should be pursued with any patient. Other causes of the patient's symptoms, including circadian rhythm disturbances, an underlying mood disorder, medication effect and/or an underlying medical problem cannot be ruled out based on this test. Clinical correlation is recommended.   The patient and her referring provider will be notified of the test results. The patient will be seen in follow up in sleep clinic at [facility].  I certify that I have reviewed the raw data recording prior to the issuance of this report in accordance with the standards of the American Academy of Sleep Medicine (AASM).  INTERPRETING PHYSICIAN:   Huston Foley, MD, PhD  Board Certified in Neurology and Sleep Medicine  [facility] [address] [place], [place] [address] [phone]

## 2021-06-16 ENCOUNTER — Telehealth: Payer: Self-pay | Admitting: *Deleted

## 2021-06-16 NOTE — Telephone Encounter (Signed)
Called pt & LVM with office number asking for call back.  

## 2021-06-16 NOTE — Telephone Encounter (Signed)
-----   Message from Huston Foley, MD sent at 06/12/2021 12:43 PM EST ----- Patient was seen by me on 05/28/2021 for worsening sleep-related symptoms, she had a home sleep test on 06/10/2021.    Please call and notify the patient that the recent home sleep test showed obstructive sleep apnea. OSA is overall mild, but worth treating to see if she feels better after treatment. To that end I recommend treatment for this in the form of autoPAP, which means, that we don't have to bring her in for a sleep study with CPAP, but will let her try an autoPAP machine at home, through a DME company (of her choice, or as per insurance requirement). The DME representative will educate her on how to use the machine, how to put the mask on, etc. I have placed an order in the chart. Please send referral, talk to patient, send report to referring MD. We will need a FU in sleep clinic for 10 weeks post-PAP set up, please arrange that with me or one of our NPs. Thanks,   Huston Foley, MD, PhD [facility])

## 2021-06-17 ENCOUNTER — Telehealth: Payer: Self-pay | Admitting: *Deleted

## 2021-06-17 ENCOUNTER — Encounter: Payer: Self-pay | Admitting: *Deleted

## 2021-06-17 NOTE — Telephone Encounter (Signed)
I called pt. I advised pt that Dr. Frances Furbish reviewed their sleep study results and found that pt has OSA. Dr. Frances Furbish recommends that pt start autopap. I reviewed PAP compliance expectations with the pt. Pt is agreeable to starting an auto-PAP. I advised pt that an order will be sent to a DME, [facility], and they will call the pt within about one week after they file with the pt's insurance. [facility] will show the pt how to use the machine, fit for masks, and troubleshoot the auto-PAP if needed. A follow up appt will be made for insurance purposes with Dr. Frances Furbish or NP when she gets machine. . Pt verbalized understanding to arrive 15 minutes early and bring their auto-PAP. A letter with all of this information in it will be mailed to the pt as a reminder. I verified with the pt that the address we have on file is correct. Pt verbalized understanding of results. Pt had no questions at this time but was encouraged to call back if questions arise. I have sent the order to [facility] and have received confirmation that they have received the order.

## 2021-06-17 NOTE — Telephone Encounter (Signed)
-----   Message from Huston Foley, MD sent at 06/12/2021 12:43 PM EST ----- Patient was seen by me on 05/28/2021 for worsening sleep-related symptoms, she had a home sleep test on 06/10/2021.    Please call and notify the patient that the recent home sleep test showed obstructive sleep apnea. OSA is overall mild, but worth treating to see if she feels better after treatment. To that end I recommend treatment for this in the form of autoPAP, which means, that we don't have to bring her in for a sleep study with CPAP, but will let her try an autoPAP machine at home, through a DME company (of her choice, or as per insurance requirement). The DME representative will educate her on how to use the machine, how to put the mask on, etc. I have placed an order in the chart. Please send referral, talk to patient, send report to referring MD. We will need a FU in sleep clinic for 10 weeks post-PAP set up, please arrange that with me or one of our NPs. Thanks,   Huston Foley, MD, PhD [facility])

## 2021-07-01 NOTE — Telephone Encounter (Signed)
Ross Ludwig, RN; Dimas Millin got it      Previous Messages   ----- Message -----  From: Guy Begin, RN  Sent: 06/17/2021   2:52 PM EST  To: Dimas Millin, Jari Favre, *  Subject: new autopap user                               New autopap user order in Epic:    Pamela Poole  Female, 61 y.o., 06/15/1961  MRN:  [id number]  Phone:  [phone]  c.   Thanks SY

## 2021-07-28 ENCOUNTER — Telehealth: Payer: Self-pay | Admitting: *Deleted

## 2021-07-28 NOTE — Telephone Encounter (Signed)
Called pt & LVM (ok per DPR) asking for call back to schedule an initial appt 31-90 days after she started using her machine. Left office number in message.   DME: [facility] Phone: [phone], press option 1 Fax: [phone] Resmed S10 Setup 07/14/21 (initial appt needed 08/15/21-10/12/2021)

## 2021-07-30 ENCOUNTER — Ambulatory Visit: Payer: Managed Care, Other (non HMO) | Admitting: Neurology

## 2021-09-01 ENCOUNTER — Ambulatory Visit: Payer: Managed Care, Other (non HMO) | Admitting: Adult Health

## 2021-09-23 NOTE — Progress Notes (Signed)
?[facility] ?[id number] ?[place]. [place] [address] ?[phone] ? ?     OFFICE FOLLOW UP NOTE ? ?[name] ?Date of Birth:  April 28, 1961 ?Medical Record Number:  [id number]  ? ?Reason for visit: Initial CPAP follow-up ? ? ? ?SUBJECTIVE: ? ? ?CHIEF COMPLAINT:  ?Chief Complaint  ?Patient presents with  ? Obstructive Sleep Apnea  ?  RM 2 alone ?Pt is well, having trouble tolerating CPAP.   ? ? ?HPI:  ? ?[name] is a 61 year old right-handed woman with an underlying medical history of seasonal and environmental allergies, HTN, MVP and new dx of OSA. ?  ?Update 09/23/2021 JM: Patient returns for initial CPAP compliance visit.  Completed HST 06/10/2021 which showed overall mild OSA with total AHI of 12.6/h and O2 nadir of 83%.  Recommended treatment with AutoPap due to medical history and sleep related complaints.  AutoPap started 1/26.  ? ?She was provided with nasal pillow but experienced pain with both mask and straps after about 40 minutes -she contacted her DME company [employer] who recommend trialing nasal mask which was around 1/28 - she waited approx 3 weeks , called [employer] as she had not yet received new mask, she was told they would expedite a new mask and after another few weeks, only received a chin strap. She has still not yet received a new mask. She did try again last night but after about any hr, started to experience similar pain. she has multiple concerns regarding [employer] today. She questions other treatment options. She does use Invisalign nightly. Patient reports husband stated her snoring has worsened.  Epworth Sleepiness Scale 12/24.  Fatigue severity scale 31/63 (does have worsening fatigue towards end of day).  ? ? ? ? ? ?History copied for reference purposes only ?05/28/2021 Dr. Frances Furbish: She reports worsening snoring and daytime somnolence.  Her Epworth sleepiness score is 8 out of 24, fatigue severity score is 31/63.  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  She tries to sleep longer on the weekends and in the summertime when school is out she can sleep a little longer.  She drinks caffeine in the form of coffee, 1 or 2 cups in the mornings.  Alcohol occasionally, typically 3-4 times per week.  She is in non-smoker.  Has noticed T2 immunity.  They have a small dog in the household, the dog does not sleep on the bed with them.  She has a family history of sleep apnea affecting 2 brothers and her dad who passed away.  She would like to pursue sleep testing at this time. ?  ?  ?07/11/19 Dr. Frances Furbish: (She) reports snoring for the past 2 years or so. She has had some recent blood pressure elevation, she is keeping a blood pressure log.  She has noted a tendency to have higher blood pressure values first thing in the morning.  She has experienced daytime somnolence in the past year or 2.  She has a family history of sleep apnea affecting her father and brother, her brother is on a CPAP machine, her father had surgery, brother also had surgery.  She herself had nasal septum surgery and turbinate reduction when she was in college and her early 7s.  She reports that her snoring can be loud per husband's report.  She does not wake up gasping for air.  She has woken up with a headache on occasion  and does wake up to go to the bathroom once or twice per average night.  Bedtime is generally between 10 and 11, rise time around 5.  She lives with her husband and 59 year old daughter, she works full-time as a Runner, broadcasting/film/videoteacher.  She teaches third grade, she has 3 dogs and 1 cat in the household, 1 dog generally tends to be in their bedroom at night, she has a TV in the bedroom and tries to turn it off before falling asleep.  She is a side sleeper.  She has been using a retainer at night, she does not report any bruxism.  She does not have any telltale symptoms of restless leg syndrome or leg twitching at night but has had leg cramps  at night.  She is a non-smoker, smoked some in college, she drinks alcohol about 3 days out of the week in the form of wine, does not drink a significant amount of coffee, usually 1-1/2 cups of half calf coffee, no sodas.  Her Epworth sleepiness score is 10 out of 24, fatigue severity score is 42 out of 63. ? ? ? ? ? ? ?ROS:   ?14 system review of systems performed and negative with exception of those listed in HPI ? ?PMH:  ?Past Medical History:  ?Diagnosis Date  ? High blood pressure   ? Multiple food allergies 04/25/2013  ? Apples, Cherries  ? MVP (mitral valve prolapse)   ? Mild late systolic prolapse of the posterior miltral valve leaflet with late systolic MR that is mild. The mitral valve is myxomatous. Mild mitral valve regurgitation. There is mild late systolic prolapse of the medial segment of the PMVL by echo 06/2020  ? ? ?PSH: History reviewed. No pertinent surgical history. ? ?Social History:  ?Social History  ? ?Socioeconomic History  ? Marital status: Married  ?  Spouse name: Not on file  ? Number of children: Not on file  ? Years of education: Not on file  ? Highest education level: Not on file  ?Occupational History  ? Not on file  ?Tobacco Use  ? Smoking status: Never  ? Smokeless tobacco: Never  ?Substance and Sexual Activity  ? Alcohol use: Yes  ?  Comment: OCC  ? Drug use: No  ? Sexual activity: Not on file  ?Other Topics Concern  ? Not on file  ?Social History Narrative  ? Not on file  ? ?Social Determinants of Health  ? ?Financial Resource Strain: Not on file  ?Food Insecurity: Not on file  ?Transportation Needs: Not on file  ?Physical Activity: Not on file  ?Stress: Not on file  ?Social Connections: Not on file  ?Intimate Partner Violence: Not on file  ? ? ?Family History:  ?Family History  ?Problem Relation Age of Onset  ? Sleep apnea Father   ? Sleep apnea Brother   ? Sleep apnea Brother   ? ? ?Medications:   ?Current Outpatient Medications on File Prior to Visit  ?Medication Sig Dispense  Refill  ? EPINEPHrine (EPI-PEN) 0.3 mg/0.3 mL SOAJ injection Inject 0.3 mLs (0.3 mg total) into the muscle once. 1 Device 1  ? hydrochlorothiazide (MICROZIDE) 12.5 MG capsule Take 12.5 mg by mouth daily.    ? metoprolol tartrate (LOPRESSOR) 100 MG tablet Take 1 tablet (100 mg) two hours prior to CT scan. 1 tablet 0  ? MULTIPLE VITAMINS PO Take 1 tablet by mouth daily.    ? ?No current facility-administered medications on file prior to visit.  ? ? ?  Allergies:  No Known Allergies ? ? ? ?OBJECTIVE: ? ?Physical Exam ? ?Vitals:  ? 09/24/21 0819  ?BP: 130/77  ?Pulse: 69  ?Weight: 180 lb (81.6 kg)  ?Height: 5\' 11"  (1.803 m)  ? ?Body mass index is 25.1 kg/m? ?No results found. ? ?General: well developed, well nourished, very pleasant middle-age Caucasian female, seated, in no evident distress ?Head: head normocephalic and atraumatic.   ?Neck: supple with no carotid or supraclavicular bruits ?Cardiovascular: regular rate and rhythm, no murmurs ?Musculoskeletal: no deformity ?Skin:  no rash/petichiae ?Vascular:  Normal pulses all extremities ?  ?Neurologic Exam ?Mental Status: Awake and fully alert. Oriented to place and time. Recent and remote memory intact. Attention span, concentration and fund of knowledge appropriate. Mood and affect appropriate.  ?Cranial Nerves: Pupils equal, briskly reactive to light. Extraocular movements full without nystagmus. Visual fields full to confrontation. Hearing intact. Facial sensation intact. Face, tongue, palate moves normally and symmetrically.  ?Motor: Normal bulk and tone. Normal strength in all tested extremity muscles ?Sensory.: intact to touch , pinprick , position and vibratory sensation.  ?Coordination: Rapid alternating movements normal in all extremities. Finger-to-nose and heel-to-shin performed accurately bilaterally. ?Gait and Station: Arises from chair without difficulty. Stance is normal. Gait demonstrates normal stride length and balance without use of AD. Tandem walk  and heel toe without difficulty.  ?Reflexes: 1+ and symmetric. Toes downgoing.  ? ? ? ? ? ? ? ?ASSESSMENT: Pamela Poole is a 61 y.o. year old female with recent diagnosis of mild OSA per HST complete

## 2021-09-23 NOTE — Telephone Encounter (Addendum)
Contacted pt, reminded her to bring CPAP machine in with her to appt 09/24/21. She understood  ?

## 2021-09-24 ENCOUNTER — Telehealth: Payer: Self-pay

## 2021-09-24 ENCOUNTER — Ambulatory Visit: Payer: Managed Care, Other (non HMO) | Admitting: Adult Health

## 2021-09-24 ENCOUNTER — Encounter: Payer: Self-pay | Admitting: Adult Health

## 2021-09-24 VITALS — BP 130/77 | HR 69 | Ht 71.0 in | Wt 180.0 lb

## 2021-09-24 DIAGNOSIS — G4733 Obstructive sleep apnea (adult) (pediatric): Secondary | ICD-10-CM | POA: Diagnosis not present

## 2021-09-24 DIAGNOSIS — Z789 Other specified health status: Secondary | ICD-10-CM

## 2021-09-24 NOTE — Telephone Encounter (Signed)
Contacted pt, VM was full.  ?

## 2021-09-24 NOTE — Telephone Encounter (Signed)
Contacted pt home, no answer  ?

## 2021-09-24 NOTE — Telephone Encounter (Signed)
-----   Message from Frann Rider, NP sent at 09/24/2021  4:33 PM EDT ----- ?Can you please let patient know recommendations regarding Bongo RX device -further discussed with our sleep lab manager who is not aware of this new device - she did encourage patient to work on CPAP tolerance and if difficulties persist, she should schedule a follow-up visit with Dr. Rexene Alberts to further discuss. Sleep lab manager also plans on reaching out to aero care manager to discuss patients concerns.  ?

## 2022-01-04 ENCOUNTER — Encounter: Payer: Self-pay | Admitting: Neurology

## 2022-01-04 ENCOUNTER — Other Ambulatory Visit: Payer: Self-pay | Admitting: *Deleted

## 2022-01-04 DIAGNOSIS — G4733 Obstructive sleep apnea (adult) (pediatric): Secondary | ICD-10-CM

## 2022-01-04 NOTE — Telephone Encounter (Signed)
Please send referral to patient's dentist for treatment and ongoing management for OSA with oral appliance.

## 2022-01-04 NOTE — Addendum Note (Signed)
Addended by: Bertram Savin on: 01/04/2022 03:38 PM   Modules accepted: Orders

## 2022-01-07 ENCOUNTER — Telehealth: Payer: Self-pay | Admitting: Neurology

## 2022-01-07 NOTE — Telephone Encounter (Signed)
Referral for Dentistry sent to Dr. Neoma [facility] [phone].

## 2022-03-20 ENCOUNTER — Telehealth: Payer: Self-pay | Admitting: Nurse Practitioner

## 2022-03-20 NOTE — Telephone Encounter (Signed)
Patient of Dr Lorie Apley called answering service, she is scheduled for colonoscopy Wed 03/24/2022 and she was apparently instructed to start [place] 9/18 until 9/25 but she did not see these instructions until today.  I informed the patient to start [place] today and to contact Dr. Collene Mares on Monday  9/25 for any further instructions/bowel prep recommendations prior to her colonoscopy.

## 2022-03-24 ENCOUNTER — Ambulatory Visit: Payer: Managed Care, Other (non HMO) | Attending: General Practice | Admitting: General Practice

## 2022-03-24 ENCOUNTER — Ambulatory Visit: Payer: Managed Care, Other (non HMO) | Attending: General Practice

## 2022-03-24 ENCOUNTER — Other Ambulatory Visit: Payer: Self-pay | Admitting: General Practice

## 2022-03-24 ENCOUNTER — Encounter: Payer: Self-pay | Admitting: General Practice

## 2022-03-24 VITALS — BP 124/64 | HR 74 | Ht 71.0 in | Wt 180.6 lb

## 2022-03-24 DIAGNOSIS — R0789 Other chest pain: Secondary | ICD-10-CM

## 2022-03-24 DIAGNOSIS — R002 Palpitations: Secondary | ICD-10-CM | POA: Diagnosis not present

## 2022-03-24 DIAGNOSIS — R079 Chest pain, unspecified: Secondary | ICD-10-CM

## 2022-03-24 DIAGNOSIS — I10 Essential (primary) hypertension: Secondary | ICD-10-CM | POA: Diagnosis not present

## 2022-03-24 NOTE — Progress Notes (Signed)
Cardiology Clinic Note   Patient Name: Pamela Poole Date of Encounter: 03/24/2022  Primary Care Provider:  Teena Irani, PA-C Primary Cardiologist:  Armanda Magic, MD  Patient Profile    Pamela Poole 61 year old female presents to the clinic today for evaluation of her chest discomfort.  Past Medical History    Past Medical History:  Diagnosis Date   High blood pressure    Multiple food allergies 04/25/2013   Apples, Cherries   MVP (mitral valve prolapse)    Mild late systolic prolapse of the posterior miltral valve leaflet with late systolic MR that is mild. The mitral valve is myxomatous. Mild mitral valve regurgitation. There is mild late systolic prolapse of the medial segment of the PMVL by echo 06/2020   History reviewed. No pertinent surgical history.  Allergies  No Known Allergies  History of Present Illness    Pamela Poole has a PMH of mitral valve prolapse, multiple food allergies, and chest discomfort.  She was initially referred by her PCP to Dr. Mayford Knife for evaluation of chest discomfort.  She was seen by Dr. Mayford Knife on 05/08/2020.  She reported that during her June 2021 visit with her PCP she reported intermittent episodes of chest discomfort that had happened multiple times during the day.  She reported after her school year her chest discomfort episodes are less frequent.  In September she had recurrence of her symptoms for a few weeks and then resolved.  Her episodes last for about 1 minute and resolved.  She had no radiation of the discomfort and associated shortness of breath nausea or diaphoresis.  She described her episodes as tightening or stretching and then contracting in her chest.  There is a family history of coronary artery disease in her father.  He was diagnosed in his 87s and died in his 73s.  Her previous EKG was normal at her PCP office.  She also indicated that she would occasionally have brief fluttering sensation in her chest.  She  denied shortness of breath, PND, orthopnea, lower extremity swelling, dizziness, and syncope.  A coronary CTA was ordered and showed a coronary calcium score of 0 with normal coronary anatomy.  She presents to the clinic today for follow-up evaluation states she is noticing more frequent episodes of squeezing, fluttering, pressure type sensation over the last several weeks.  She is a third grade teacher.  She is not as active as she used to be however she is able to exercise vigorously on her treadmill for 30 minutes without chest discomfort.  She also took a vacation this summer and [place]  She had no chest discomfort with the activities.  We reviewed her coronary CTA and echocardiogram.  She expressed understanding.  Her episodes last for a few seconds and dissipate on their own.  We also reviewed her OSA.  She had a home sleep study which showed mild OSA.  She was unable to tolerate CPAP and now wears an oral device.  She is also taking magnesium for bowel regularity.  She has a colonoscopy coming up with Dr. Loreta Ave.  Due to her increased symptoms I will order a 14-day cardiac event monitor, magnesium, and BMP.  We will plan follow-up for 6 to 8 weeks.  Today she denies chest pain, shortness of breath, lower extremity edema, fatigue, melena, hematuria, hemoptysis, presyncope, syncope, orthopnea, and PND.    Home Medications    Prior to Admission medications   Medication  Sig Start Date End Date Taking? Authorizing Provider  EPINEPHrine (EPI-PEN) 0.3 mg/0.3 mL SOAJ injection Inject 0.3 mLs (0.3 mg total) into the muscle once. 04/25/13   Dorena Bodo, PA-C  hydrochlorothiazide (MICROZIDE) 12.5 MG capsule Take 12.5 mg by mouth daily. 05/01/20   [provider]  metoprolol tartrate (LOPRESSOR) 100 MG tablet Take 1 tablet (100 mg) two hours prior to CT scan. 01/05/21   Quintella Reichert, MD  MULTIPLE VITAMINS PO Take 1 tablet by mouth daily.    [provider]    Family History    Family History  Problem Relation Age of Onset   Sleep apnea Father    Sleep apnea Brother    Sleep apnea Brother    She indicated that the status of her father is unknown. She indicated that only one of her two brothers is alive.  Social History    Social History   Socioeconomic History   Marital status: Married    Spouse name: Not on file   Number of children: Not on file   Years of education: Not on file   Highest education level: Not on file  Occupational History   Not on file  Tobacco Use   Smoking status: Never   Smokeless tobacco: Never  Substance and Sexual Activity   Alcohol use: Yes    Comment: OCC   Drug use: No   Sexual activity: Not on file  Other Topics Concern   Not on file  Social History Narrative   Not on file   Social Determinants of Health   Financial Resource Strain: Not on file  Food Insecurity: Not on file  Transportation Needs: Not on file  Physical Activity: Not on file  Stress: Not on file  Social Connections: Not on file  Intimate Partner Violence: Not on file     Review of Systems    General:  No chills, fever, night sweats or weight changes.  Cardiovascular:  No chest pain, dyspnea on exertion, edema, orthopnea, palpitations, paroxysmal nocturnal dyspnea. Dermatological: No rash, lesions/masses Respiratory: No cough, dyspnea Urologic: No hematuria, dysuria Abdominal:   No nausea, vomiting, diarrhea, bright red blood per rectum, melena, or hematemesis Neurologic:  No visual changes, wkns, changes in mental status. All other systems reviewed and are otherwise negative except as noted above.  Physical Exam    VS:  BP 124/64   Pulse 74   Ht 5\' 11"  (1.803 m)   Wt 180 lb 9.6 oz (81.9 kg)   SpO2 98%   BMI 25.19 kg/m  , BMI Body mass index is 25.19 kg/m. GEN: Well nourished, well developed, in no acute distress. HEENT: normal. Neck: Supple, no JVD, carotid bruits, or masses. Cardiac: RRR, no murmurs,  rubs, or gallops. No clubbing, cyanosis, edema.  Radials/DP/PT 2+ and equal bilaterally.  Respiratory:  Respirations regular and unlabored, clear to auscultation bilaterally. GI: Soft, nontender, nondistended, BS + x 4. MS: no deformity or atrophy. Skin: warm and dry, no rash. Neuro:  Strength and sensation are intact. Psych: Normal affect.  Accessory Clinical Findings    Recent Labs: No results found for requested labs within last 365 days.   Recent Lipid Panel No results found for: "CHOL", "TRIG", "HDL", "CHOLHDL", "VLDL", "LDLCALC", "LDLDIRECT"       ECG personally reviewed by me today-normal sinus rhythm 74 bpm- No acute changes  Echocardiogram 07/08/2020  IMPRESSIONS     1. Left ventricular ejection fraction, by estimation, is 60 to 65%. The  left ventricle has normal function. The left ventricle has no regional  wall motion abnormalities. Left ventricular diastolic parameters were  normal.   2. Right ventricular systolic function is normal. The right ventricular  size is normal. Tricuspid regurgitation signal is inadequate for assessing  PA pressure.   3. Mild late systolic prolapse of the posterior miltral valve leaflet  with late systolic MR that is mild. The mitral valve is myxomatous. Mild  mitral valve regurgitation. No evidence of mitral stenosis. There is mild  late systolic prolapse of the medial   scallop of the posterior leaflet of the mitral valve.   4. The aortic valve is tricuspid. Aortic valve regurgitation is not  visualized. No aortic stenosis is present.   5. The inferior vena cava is dilated in size with >50% respiratory  variability, suggesting right atrial pressure of 8 mmHg.   FINDINGS   Left Ventricle: Left ventricular ejection fraction, by estimation, is 60  to 65%. The left ventricle has normal function. The left ventricle has no  regional wall motion abnormalities. The left ventricular internal cavity  size was normal in size. There is    no left ventricular hypertrophy. Left ventricular diastolic parameters  were normal.   Right Ventricle: The right ventricular size is normal. No increase in  right ventricular wall thickness. Right ventricular systolic function is  normal. Tricuspid regurgitation signal is inadequate for assessing PA  pressure.   Left Atrium: Left atrial size was normal in size.   Right Atrium: Right atrial size was normal in size.   Pericardium: Trivial pericardial effusion is present.   Mitral Valve: Mild late systolic prolapse of the posterior miltral valve  leaflet with late systolic MR that is mild. The mitral valve is  myxomatous. There is mild late systolic prolapse of the medial scallop of  the posterior leaflet of the mitral valve.  There is mild thickening of the anterior and posterior mitral valve  leaflet(s). Mild mitral valve regurgitation. No evidence of mitral valve  stenosis.   Tricuspid Valve: The tricuspid valve is grossly normal. Tricuspid valve  regurgitation is trivial. No evidence of tricuspid stenosis.   Aortic Valve: The aortic valve is tricuspid. Aortic valve regurgitation is  not visualized. No aortic stenosis is present.   Pulmonic Valve: The pulmonic valve was grossly normal. Pulmonic valve  regurgitation is trivial. No evidence of pulmonic stenosis.   Aorta: The aortic root and ascending aorta are structurally normal, with  no evidence of dilitation.   Venous: The right upper pulmonary vein is normal. The inferior vena cava  is dilated in size with greater than 50% respiratory variability,  suggesting right atrial pressure of 8 mmHg.   IAS/Shunts: The atrial septum is grossly normal.   Coronary CTA 01/07/2021  FINDINGS: Image quality: Excellent.   Noise artifact is: Limited. Mild stair step mis registration artifact.   Coronary Arteries:  Normal coronary origin.  Right dominance.   Left main: The left main is a large caliber vessel with a normal take off  from the left coronary cusp that bifurcates to form a left anterior descending artery and a left circumflex artery. There is no plaque or stenosis.   Left anterior descending artery: The LAD is patent without evidence of plaque or stenosis. The LAD gives off 2 patent diagonal branches.   Left circumflex artery: The LCX is non-dominant and patent with no evidence of plaque or stenosis. The LCX gives off 3 patent obtuse marginal branches.   Right coronary  artery: The RCA is dominant with normal take off from the right coronary cusp. There is no evidence of plaque or stenosis. The RCA terminates as a PDA and right posterolateral branch without evidence of plaque or stenosis.   Right Atrium: Right atrial size is within normal limits.   Right Ventricle: The right ventricular cavity is within normal limits.   Left Atrium: Left atrial size is normal in size with no left atrial appendage filling defect.   Left Ventricle: The ventricular cavity size is within normal limits. There are no stigmata of prior infarction. There is no abnormal filling defect.   Pulmonary arteries: Normal in size without proximal filling defect.   Pulmonary veins: Normal pulmonary venous drainage.   Pericardium: Normal thickness with no significant effusion or calcium present.   Cardiac valves: The aortic valve is trileaflet without significant calcification. The mitral valve is normal structure with moderate thickening.   Aorta: Normal caliber with no significant disease.   Extra-cardiac findings: See attached radiology report for non-cardiac structures.   IMPRESSION: 1. Coronary calcium score of 0.   2. Normal coronary origin with right dominance.   3. Normal coronary arteries.   RECOMMENDATIONS: 1. CAD-RADS 0: No evidence of CAD (0%). Consider non-atherosclerotic causes of chest pain.   Lennie Odor, MD     Electronically Signed   By: Lennie Odor   On: 01/07/2021 13:49 Assessment &  Plan   1.  Chest discomfort-no chest pain today.  Reports more frequent episodes of chest discomfort which she describes as squeezing or pressure.  Had coronary CTA 01/07/2021 which showed coronary calcium score of 0.  Echocardiogram 07/08/2020 showed normal LV function and mild systolic prolapse, mild MR. 14-day cardiac event monitor  Essential hypertension-BP today 124/64. Continue HCTZ Heart healthy low-sodium diet-salty 6 given Increase physical activity as tolerated   Palpitations-no recent episodes of irregular or accelerated heart rate.  Previously noted fluttering sensation that would last for a minute and dissipate on its own.  Continues to notice occasional episodes, nonexertional, more frequent this fall. Maintain p.o. hydration Order 14-day cardiac event monitor Magnesium, BMP  Disposition: Follow-up with Dr. Mayford Knife or me in 8 wks   Thomasene Ripple. Mauriah Mcmillen NP-C     03/24/2022, 2:28 PM [facility] [address] Office [phone] Fax [phone]  Notice: This dictation was prepared with Dragon dictation along with smaller phrase technology. Any transcriptional errors that result from this process are unintentional and may not be corrected upon review.  I spent 14 minutes examining this patient, reviewing medications, and using patient centered shared decision making involving her cardiac care.  Prior to her visit I spent greater than 20 minutes reviewing her past medical history,  medications, and prior cardiac tests.

## 2022-03-24 NOTE — Progress Notes (Unsigned)
Enrolled for [employer] to mail a ZIO XT long term holter monitor to the patients address on file.   Dr. Turner to read. 

## 2022-03-24 NOTE — Patient Instructions (Signed)
Medication Instructions:  The current medical regimen is effective;  continue present plan and medications as directed. Please refer to the Current Medication list given to you today.  *If you need a refill on your cardiac medications before your next appointment, please call your pharmacy*   Lab Work: [facility] AND [place]  If you have labs (blood work) drawn today and your tests are completely normal, you will receive your results only by:  [place] (if you have MyChart) OR A paper copy in the mail  If you have any lab test that is abnormal or we need to change your treatment, we will call you to review the results.   Testing/Procedures: Your physician has requested you wear a ZIO patch monitor for 14 days. SEE ATTACHED   Follow-Up: At [facility], you and your health needs are our priority.  As part of our continuing mission to provide you with exceptional heart care, we have created designated Provider Care Teams.  These Care Teams include your primary Cardiologist (physician) and Advanced Practice Providers (APPs -  Physician Assistants and Nurse Practitioners) who all work together to provide you with the care you need, when you need it.  Your next appointment:   8 week(s)  The format for your next appointment:   In Person  Provider:   Fransico Him, MD  or Coletta Memos, FNP-C       Important Information About [place] [facility] Monitor Instructions  Your physician has requested you wear a ZIO patch monitor for 14 days.  This is a single patch monitor. [employer] supplies one patch monitor per enrollment. Additional stickers are not available. Please do not apply patch if you will be having a Nuclear Stress Test,  Echocardiogram, Cardiac CT, MRI, or Chest Xray during the period you would be wearing the  monitor. The patch cannot be worn during these tests. You cannot remove and re-apply the  ZIO XT patch monitor.  Your ZIO patch monitor will be  mailed 3 day USPS to your address on file. It may take 3-5 days  to receive your monitor after you have been enrolled.  Once you have received your monitor, please review the enclosed instructions. Your monitor  has already been registered assigning a specific monitor serial # to you.  Billing and Patient Assistance Program Information  We have supplied [employer] with any of your insurance information on file for billing purposes. [employer] offers a sliding scale Patient Assistance Program for patients that do not have  insurance, or whose insurance does not completely cover the cost of the ZIO monitor.  You must apply for the Patient Assistance Program to qualify for this discounted rate.  To apply, please call [employer] at [phone], select option 4, select option 2, ask to apply for  Patient Assistance Program. Theodore Demark will ask your household income, and how many people  are in your household. They will quote your out-of-pocket cost based on that information.  [employer] will also be able to set up a 65-month, interest-free payment plan if needed.  Applying the monitor   Shave hair from upper left chest.  Hold abrader disc by orange tab. Rub abrader in 40 strokes over the upper left chest as  indicated in your monitor instructions.  Clean area with 4 enclosed alcohol pads. Let dry.  Apply patch as indicated in monitor instructions. Patch will be placed under collarbone on left  side of chest with arrow  pointing upward.  Rub patch adhesive wings for 2 minutes. Remove white label marked "1". Remove the white  label marked "2". Rub patch adhesive wings for 2 additional minutes.  While looking in a mirror, press and release button in center of patch. A small green light will  flash 3-4 times. This will be your only indicator that the monitor has been turned on.  Do not shower for the first 24 hours. You may shower after the first 24 hours.  Press the button if you feel a symptom. You will hear  a small click. Record Date, Time and  Symptom in the Patient Logbook.  When you are ready to remove the patch, follow instructions on the last 2 pages of Patient  Logbook. Stick patch monitor onto the last page of Patient Logbook.  Place Patient Logbook in the blue and white box. Use locking tab on box and tape box closed  securely. The blue and white box has prepaid postage on it. Please place it in the mailbox as  soon as possible. Your physician should have your test results approximately 7 days after the  monitor has been mailed back to [facility].  Call Philip at [phone] if you have questions regarding  your ZIO XT patch monitor. Call them immediately if you see an orange light blinking on your  monitor.  If your monitor falls off in less than 4 days, contact our Monitor department at [phone].  If your monitor becomes loose or falls off after 4 days call [employer] at [phone] for  suggestions on securing your monitor

## 2022-03-25 LAB — BASIC METABOLIC PANEL
BUN/Creatinine Ratio: 29 — ABNORMAL HIGH (ref 9–23)
BUN: 22 mg/dL (ref 6–24)
CO2: 26 mmol/L (ref 20–29)
Calcium: 9.4 mg/dL (ref 8.7–10.2)
Chloride: 101 mmol/L (ref 96–106)
Creatinine, Ser: 0.76 mg/dL (ref 0.57–1.00)
Glucose: 99 mg/dL (ref 70–99)
Potassium: 3.8 mmol/L (ref 3.5–5.2)
Sodium: 143 mmol/L (ref 134–144)
eGFR: 90 mL/min/{1.73_m2} (ref >59)

## 2022-03-25 LAB — MAGNESIUM: Magnesium: 2.2 mg/dL (ref 1.6–2.3)

## 2022-03-30 ENCOUNTER — Ambulatory Visit: Payer: Managed Care, Other (non HMO) | Admitting: Physician Assistant

## 2022-04-09 DIAGNOSIS — R0789 Other chest pain: Secondary | ICD-10-CM

## 2022-04-09 DIAGNOSIS — R002 Palpitations: Secondary | ICD-10-CM | POA: Diagnosis not present

## 2022-05-13 NOTE — Progress Notes (Unsigned)
Cardiology Clinic Note   Patient Name: Pamela Poole Date of Encounter: 05/19/2022  Primary Care Provider:  Teena Irani, PA-C Primary Cardiologist:  Armanda Magic, MD  Patient Profile    Pamela Poole 61 year old female presents to the clinic today for follow-up of her palpitations and chest discomfort.  Past Medical History    Past Medical History:  Diagnosis Date   High blood pressure    Multiple food allergies 04/25/2013   Apples, Cherries   MVP (mitral valve prolapse)    Mild late systolic prolapse of the posterior miltral valve leaflet with late systolic MR that is mild. The mitral valve is myxomatous. Mild mitral valve regurgitation. There is mild late systolic prolapse of the medial segment of the PMVL by echo 06/2020   History reviewed. No pertinent surgical history.  Allergies  No Known Allergies  History of Present Illness    Pamela Poole has a PMH of mitral valve prolapse, multiple food allergies, and chest discomfort.  She was initially referred by her PCP to Dr. Mayford Knife for evaluation of chest discomfort.  She was seen by Dr. Mayford Knife on 05/08/2020.  She reported that during her June 2021 visit with her PCP she reported intermittent episodes of chest discomfort that had happened multiple times during the day.  She reported after her school year her chest discomfort episodes are less frequent.  In September she had recurrence of her symptoms for a few weeks and then resolved.  Her episodes last for about 1 minute and resolved.  She had no radiation of the discomfort and associated shortness of breath nausea or diaphoresis.  She described her episodes as tightening or stretching and then contracting in her chest.  There is a family history of coronary artery disease in her father.  He was diagnosed in his 32s and died in his 79s.  Her previous EKG was normal at her PCP office.  She also indicated that she would occasionally have brief fluttering sensation  in her chest.  She denied shortness of breath, PND, orthopnea, lower extremity swelling, dizziness, and syncope.  A coronary CTA was ordered and showed a coronary calcium score of 0 with normal coronary anatomy.  She presented to the clinic 03/24/22 for follow-up evaluation stated she was noticing more frequent episodes of squeezing, fluttering, pressure type sensation over the last several weeks.  She is a third grade teacher.  She was not as active as she used to be however she was able to exercise vigorously on her treadmill for 30 minutes without chest discomfort.  She also took a vacation during the summer and [place].  She had no chest discomfort with the activities.  We reviewed her coronary CTA and echocardiogram.  She expressed understanding.  Her episodes would last for a few seconds and dissipate on their own.  We also reviewed her OSA.  She had a home sleep study which showed mild OSA.  She was unable to tolerate CPAP and was wearing an oral device.  She was also taking magnesium for bowel regularity.  She had a colonoscopy planned with Dr. Loreta Ave.  Due to her increased symptoms I ordered a 14-day cardiac event monitor, magnesium, and BMP.  We planned follow-up for 6 to 8 weeks. Her lab work was unremarkable.  Her cardiac event monitor showed predominantly normal sinus rhythm with an average heart rate of 69 bpm.  It ranged from 46-169.  She was noted to have transient  idioventricular rhythm 12 beats associated with patient triggered.  She had occasional PVCs, ventricular triplets, bigeminy, trigeminy PVCs with a PVC load of 1%.  She was also noted to have PACs, atrial couplets, triplets and short bursts of SVT up to 18 beats which were also triggered events.  She presents to the clinic today for follow-up evaluation states she continues to notice occasional episodes of tightness and squeezing.  She notices changes with her heart rate with changing positions such as  standing and sitting.  She reports that she could do better with her hydration.  She did complete her hiking trip and did not notice any chest tightness or squeezing with increased activity hiking.  We reviewed her HCTZ that she started around 2 years ago for blood pressure control.  I suspect that her fluid volume may be contributing to her palpitations.  I will discontinue her HCTZ and start her on losartan 12.5 mg daily.  We will repeat a BMP in 1 week and plan follow-up in 4 to 6 months.  We also reviewed calcium supplementation and weightbearing exercise.  Today she denies chest pain, shortness of breath, lower extremity edema, fatigue, melena, hematuria, hemoptysis, presyncope, syncope, orthopnea, and PND.    Home Medications    Prior to Admission medications   Medication Sig Start Date End Date Taking? Authorizing Provider  EPINEPHrine (EPI-PEN) 0.3 mg/0.3 mL SOAJ injection Inject 0.3 mLs (0.3 mg total) into the muscle once. 04/25/13   Dorena Bodo, PA-C  hydrochlorothiazide (MICROZIDE) 12.5 MG capsule Take 12.5 mg by mouth daily. 05/01/20   [provider]  metoprolol tartrate (LOPRESSOR) 100 MG tablet Take 1 tablet (100 mg) two hours prior to CT scan. 01/05/21   Quintella Reichert, MD  MULTIPLE VITAMINS PO Take 1 tablet by mouth daily.    [provider]    Family History    Family History  Problem Relation Age of Onset   Sleep apnea Father    Sleep apnea Brother    Sleep apnea Brother    She indicated that the status of her father is unknown. She indicated that only one of her two brothers is alive.  Social History    Social History   Socioeconomic History   Marital status: Married    Spouse name: Not on file   Number of children: Not on file   Years of education: Not on file   Highest education level: Not on file  Occupational History   Not on file  Tobacco Use   Smoking status: Never   Smokeless tobacco: Never  Substance and Sexual Activity    Alcohol use: Yes    Comment: OCC   Drug use: No   Sexual activity: Not on file  Other Topics Concern   Not on file  Social History Narrative   Not on file   Social Determinants of Health   Financial Resource Strain: Not on file  Food Insecurity: Not on file  Transportation Needs: Not on file  Physical Activity: Not on file  Stress: Not on file  Social Connections: Not on file  Intimate Partner Violence: Not on file     Review of Systems    General:  No chills, fever, night sweats or weight changes.  Cardiovascular:  No chest pain, dyspnea on exertion, edema, orthopnea, palpitations, paroxysmal nocturnal dyspnea. Dermatological: No rash, lesions/masses Respiratory: No cough, dyspnea Urologic: No hematuria, dysuria Abdominal:   No nausea, vomiting, diarrhea, bright red blood per rectum, melena, or hematemesis  Neurologic:  No visual changes, wkns, changes in mental status. All other systems reviewed and are otherwise negative except as noted above.  Physical Exam    VS:  BP 126/82   Pulse 61   Ht 5\' 11"  (1.803 m)   Wt 177 lb (80.3 kg)   SpO2 98%   BMI 24.69 kg/m  , BMI Body mass index is 24.69 kg/m. GEN: Well nourished, well developed, in no acute distress. HEENT: normal. Neck: Supple, no JVD, carotid bruits, or masses. Cardiac: RRR, no murmurs, rubs, or gallops. No clubbing, cyanosis, edema.  Radials/DP/PT 2+ and equal bilaterally.  Respiratory:  Respirations regular and unlabored, clear to auscultation bilaterally. GI: Soft, nontender, nondistended, BS + x 4. MS: no deformity or atrophy. Skin: warm and dry, no rash. Neuro:  Strength and sensation are intact. Psych: Normal affect.  Accessory Clinical Findings    Recent Labs: 03/24/2022: BUN 22; Creatinine, Ser 0.76; Magnesium 2.2; Potassium 3.8; Sodium 143   Recent Lipid Panel No results found for: "CHOL", "TRIG", "HDL", "CHOLHDL", "VLDL", "LDLCALC", "LDLDIRECT"       ECG personally reviewed by me  today-none today.  EKG 03/24/2022 normal sinus rhythm 74 bpm- No acute changes  Echocardiogram 07/08/2020  IMPRESSIONS     1. Left ventricular ejection fraction, by estimation, is 60 to 65%. The  left ventricle has normal function. The left ventricle has no regional  wall motion abnormalities. Left ventricular diastolic parameters were  normal.   2. Right ventricular systolic function is normal. The right ventricular  size is normal. Tricuspid regurgitation signal is inadequate for assessing  PA pressure.   3. Mild late systolic prolapse of the posterior miltral valve leaflet  with late systolic MR that is mild. The mitral valve is myxomatous. Mild  mitral valve regurgitation. No evidence of mitral stenosis. There is mild  late systolic prolapse of the medial   scallop of the posterior leaflet of the mitral valve.   4. The aortic valve is tricuspid. Aortic valve regurgitation is not  visualized. No aortic stenosis is present.   5. The inferior vena cava is dilated in size with >50% respiratory  variability, suggesting right atrial pressure of 8 mmHg.   FINDINGS   Left Ventricle: Left ventricular ejection fraction, by estimation, is 60  to 65%. The left ventricle has normal function. The left ventricle has no  regional wall motion abnormalities. The left ventricular internal cavity  size was normal in size. There is   no left ventricular hypertrophy. Left ventricular diastolic parameters  were normal.   Right Ventricle: The right ventricular size is normal. No increase in  right ventricular wall thickness. Right ventricular systolic function is  normal. Tricuspid regurgitation signal is inadequate for assessing PA  pressure.   Left Atrium: Left atrial size was normal in size.   Right Atrium: Right atrial size was normal in size.   Pericardium: Trivial pericardial effusion is present.   Mitral Valve: Mild late systolic prolapse of the posterior miltral valve  leaflet with  late systolic MR that is mild. The mitral valve is  myxomatous. There is mild late systolic prolapse of the medial scallop of  the posterior leaflet of the mitral valve.  There is mild thickening of the anterior and posterior mitral valve  leaflet(s). Mild mitral valve regurgitation. No evidence of mitral valve  stenosis.   Tricuspid Valve: The tricuspid valve is grossly normal. Tricuspid valve  regurgitation is trivial. No evidence of tricuspid stenosis.   Aortic Valve: The  aortic valve is tricuspid. Aortic valve regurgitation is  not visualized. No aortic stenosis is present.   Pulmonic Valve: The pulmonic valve was grossly normal. Pulmonic valve  regurgitation is trivial. No evidence of pulmonic stenosis.   Aorta: The aortic root and ascending aorta are structurally normal, with  no evidence of dilitation.   Venous: The right upper pulmonary vein is normal. The inferior vena cava  is dilated in size with greater than 50% respiratory variability,  suggesting right atrial pressure of 8 mmHg.   IAS/Shunts: The atrial septum is grossly normal.   Coronary CTA 01/07/2021  FINDINGS: Image quality: Excellent.   Noise artifact is: Limited. Mild stair step mis registration artifact.   Coronary Arteries:  Normal coronary origin.  Right dominance.   Left main: The left main is a large caliber vessel with a normal take off from the left coronary cusp that bifurcates to form a left anterior descending artery and a left circumflex artery. There is no plaque or stenosis.   Left anterior descending artery: The LAD is patent without evidence of plaque or stenosis. The LAD gives off 2 patent diagonal branches.   Left circumflex artery: The LCX is non-dominant and patent with no evidence of plaque or stenosis. The LCX gives off 3 patent obtuse marginal branches.   Right coronary artery: The RCA is dominant with normal take off from the right coronary cusp. There is no evidence of  plaque or stenosis. The RCA terminates as a PDA and right posterolateral branch without evidence of plaque or stenosis.   Right Atrium: Right atrial size is within normal limits.   Right Ventricle: The right ventricular cavity is within normal limits.   Left Atrium: Left atrial size is normal in size with no left atrial appendage filling defect.   Left Ventricle: The ventricular cavity size is within normal limits. There are no stigmata of prior infarction. There is no abnormal filling defect.   Pulmonary arteries: Normal in size without proximal filling defect.   Pulmonary veins: Normal pulmonary venous drainage.   Pericardium: Normal thickness with no significant effusion or calcium present.   Cardiac valves: The aortic valve is trileaflet without significant calcification. The mitral valve is normal structure with moderate thickening.   Aorta: Normal caliber with no significant disease.   Extra-cardiac findings: See attached radiology report for non-cardiac structures.   IMPRESSION: 1. Coronary calcium score of 0.   2. Normal coronary origin with right dominance.   3. Normal coronary arteries.   RECOMMENDATIONS: 1. CAD-RADS 0: No evidence of CAD (0%). Consider non-atherosclerotic causes of chest pain.   Lennie Odor, MD     Electronically Signed   By: Lennie Odor   On: 01/07/2021 13:49  Cardiac event monitor 05/03/2022   Predominant rhythm was Normal sinus rhythm with average heart rate 69bpm and ranged from 46 to 169bpm.   Transient idioventricular rhythm for 12 beats associated with patient triggered event   Occasional PVCs,ventricular triplets, bigeminal and  trigeminal PVCs with PVC load 1%   PACs, atrial couplets and triplets and short bursts of SVT up to 18 beats with fastest interval 169bpm also associated with patient triggered events     Patch Wear Time:  13 days and 18 hours (2023-10-13T10:37:15-0400 to 2023-10-27T05:05:13-0400)   Patient had  a min HR of 46 bpm, max HR of 169 bpm, and avg HR of 69 bpm. Predominant underlying rhythm was Sinus Rhythm. 67 Supraventricular Tachycardia runs occurred, the run with the fastest interval lasting  5 beats with a max rate of 169 bpm, the  longest lasting 18 beats with an avg rate of 139 bpm. Idioventricular Rhythm was present. Supraventricular Tachycardia and Idioventricular Rhythm were detected within +/- 45 seconds of symptomatic patient event(s). Isolated SVEs were rare (<1.0%), SVE  Couplets were rare (<1.0%), and SVE Triplets were rare (<1.0%). Isolated VEs were rare (<1.0%, 2256), VE Couplets were rare (<1.0%, 118), and VE Triplets were rare (<1.0%, 9). Ventricular Bigeminy and Trigeminy were present.   Assessment & Plan   1. Palpitations-notes occasional brief episodes of palpitations.  Cardiac event monitor 05/03/2022 showed predominantly normal sinus rhythm, transient idioventricular beats, PVCs, PACs, and SVT.  Details above.  Previously was noticing fluttering sensation that would last for a minute and dissipate on its own.  Lab work unremarkable. Increase p.o. hydration Increase physical activity as tolerated Avoid triggers caffeine, chocolate, EtOH, dehydration etc.   Chest discomfort-no chest pain today.  Previously noted  more frequent episodes of chest discomfort which she describes as squeezing or pressure.  Coronary CTA 01/07/2021 which showed coronary calcium score of 0.  Echocardiogram 06/2020 showed normal LV function and mild systolic prolapse, mild MR. Increase physical activity as tolerated-goal 150 minutes of moderate physical activity per week. Maintain heart healthy diet  Essential hypertension-BP today 126/82.  Has been taking hydrochlorothiazide for around 2 years.  Feels that she could do better with her hydration. Discontinue hydrochlorothiazide Start losartan 12.5 mg daily Heart healthy low-sodium diet-salty 6 reviewed Increase physical activity as tolerated Order BMP  in 1 week  Disposition: Follow-up with Dr. Mayford Knife or me in 4-6 months.   Thomasene Ripple. Daniyal Tabor NP-C     05/19/2022, 4:12 PM [facility] [address] Office [phone] Fax [phone]  Notice: This dictation was prepared with Dragon dictation along with smaller phrase technology. Any transcriptional errors that result from this process are unintentional and may not be corrected upon review.  I spent 14 minutes examining this patient, reviewing medications, and using patient centered shared decision making involving her cardiac care.  Prior to her visit I spent greater than 20 minutes reviewing her past medical history,  medications, and prior cardiac tests.

## 2022-05-19 ENCOUNTER — Encounter: Payer: Self-pay | Admitting: General Practice

## 2022-05-19 ENCOUNTER — Ambulatory Visit: Payer: Managed Care, Other (non HMO) | Attending: General Practice | Admitting: General Practice

## 2022-05-19 ENCOUNTER — Ambulatory Visit: Payer: Managed Care, Other (non HMO)

## 2022-05-19 VITALS — BP 126/82 | HR 61 | Ht 71.0 in | Wt 177.0 lb

## 2022-05-19 DIAGNOSIS — R079 Chest pain, unspecified: Secondary | ICD-10-CM | POA: Diagnosis not present

## 2022-05-19 DIAGNOSIS — R002 Palpitations: Secondary | ICD-10-CM | POA: Diagnosis not present

## 2022-05-19 DIAGNOSIS — I10 Essential (primary) hypertension: Secondary | ICD-10-CM | POA: Diagnosis not present

## 2022-05-19 MED ORDER — LOSARTAN POTASSIUM 25 MG PO TABS: 12.5000 mg | ORAL_TABLET | Freq: Every day | ORAL | 5 refills | Status: DC

## 2022-05-19 MED ORDER — LOSARTAN POTASSIUM 25 MG PO TABS: 25.0000 mg | ORAL_TABLET | Freq: Every day | ORAL | 7 refills | Status: DC

## 2022-05-19 NOTE — Patient Instructions (Addendum)
Medication Instructions:  Stop hydrochlorothiazide  Start losartan 25mg  daily   *If you need a refill on your cardiac medications before your next appointment, please call your pharmacy*   Lab Work: NONE If you have labs (blood work) drawn today and your tests are completely normal, you will receive your results only by:  MyChart Message (if you have MyChart) OR  A paper copy in the mail If you have any lab test that is abnormal or we need to change your treatment, we will call you to review the results.   Testing/Procedures: NONE   Follow-Up: At [facility], you and your health needs are our priority.  As part of our continuing mission to provide you with exceptional heart care, we have created designated Provider Care Teams.  These Care Teams include your primary Cardiologist (physician) and Advanced Practice Providers (APPs -  Physician Assistants and Nurse Practitioners) who all work together to provide you with the care you need, when you need it.  Your next appointment:   4-6 month(s)  The format for your next appointment:   In Person  Provider:   [facility], MD  or Armanda Magic, FNP        Other Instructions CAUSES OF HYPERTENSION:  Hormonal contraceptives, steroids, stimulants, antidepressants, weight loss medications, NSAIDs, alcohol, caffeine, licorice, ginseng, St. John's wort.  DO YOUR WEIGHT BEARING EXERCISES  TAKE AND LOG YOUR BLOOD PRESSURE-TAKE YOUR BLOOD PRESSURE AT LEAST 1-2 HOUR AFTER TAKING YOUR MEDICATION.  BRING LOG WITH YOU TO YOUR FOLLOW UP APPOINTMENT FOR REVIEW  Important Information About Sugar

## 2022-05-19 NOTE — Progress Notes (Unsigned)
Enrolled patient for a 14 day Zio XT monitor to be mailed to patients home  Dr Turner to read 

## 2022-07-27 IMAGING — MG MM DIGITAL DIAGNOSTIC UNILAT*R* W/ TOMO W/ CAD
4 series · 4 of 12 positions shown · non-contrast
Comparison: Previous exam(s).

CLINICAL DATA: Patient returns after screening study for evaluation
of possible RIGHT breast asymmetry.

EXAM:
DIGITAL DIAGNOSTIC RIGHT MAMMOGRAM WITH CAD AND TOMO
ULTRASOUND RIGHT BREAST

[R ML synth-2D]
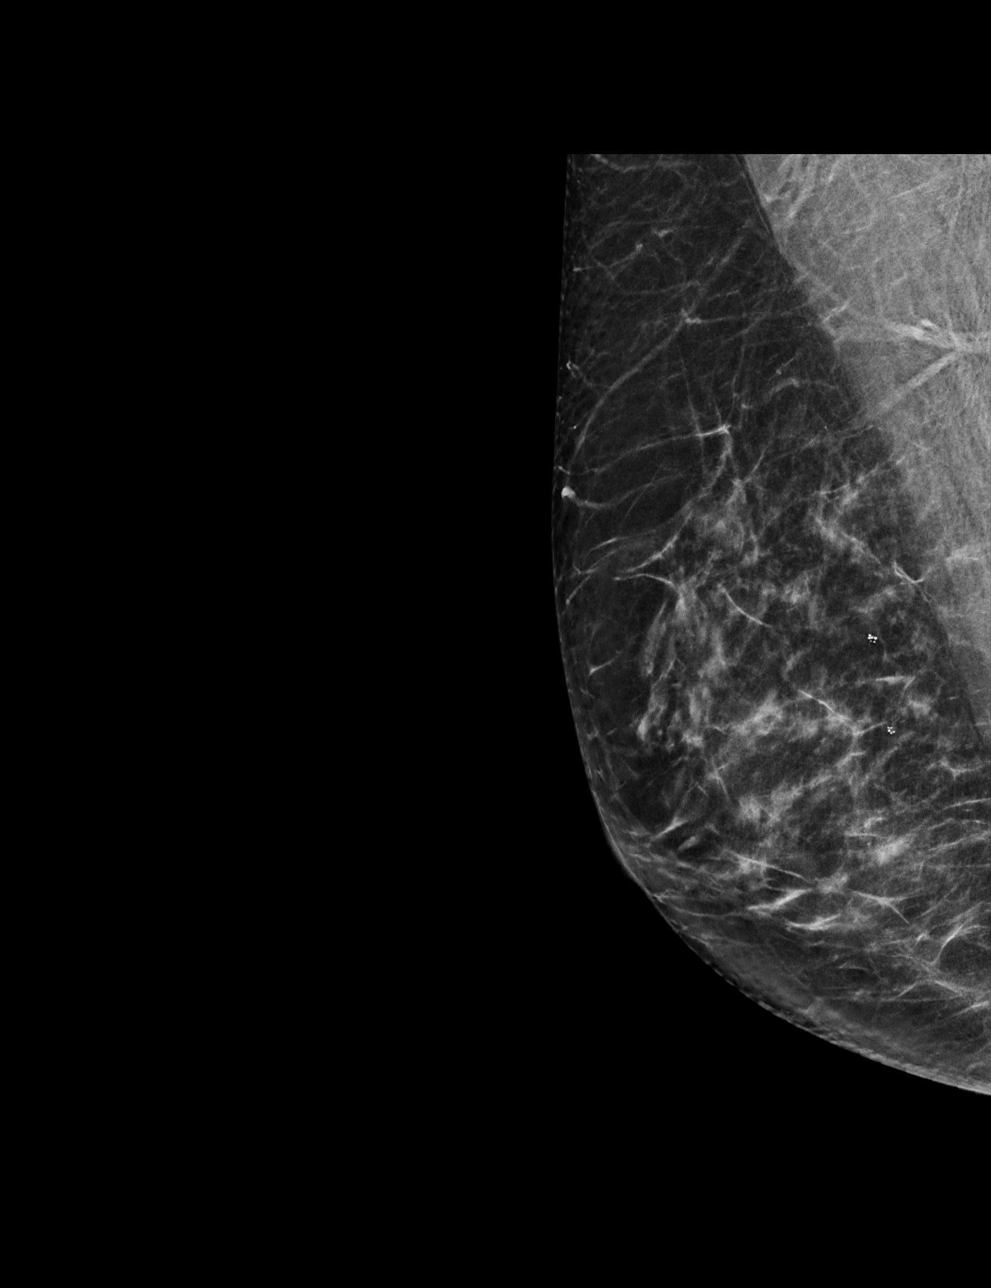

[R CC synth-2D]
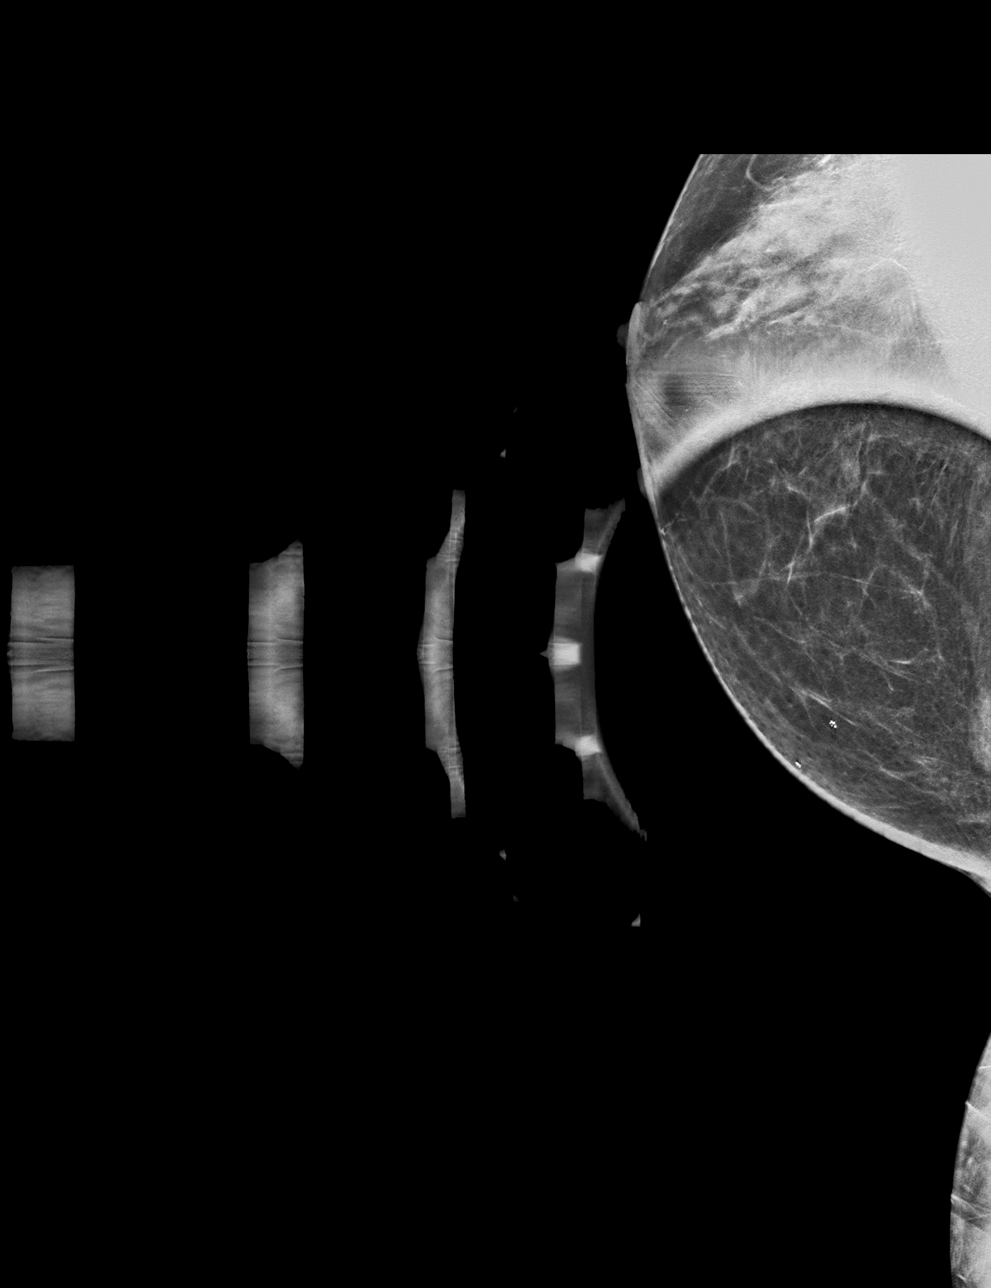

[R ML tomo · tomo slice 32/63.0]
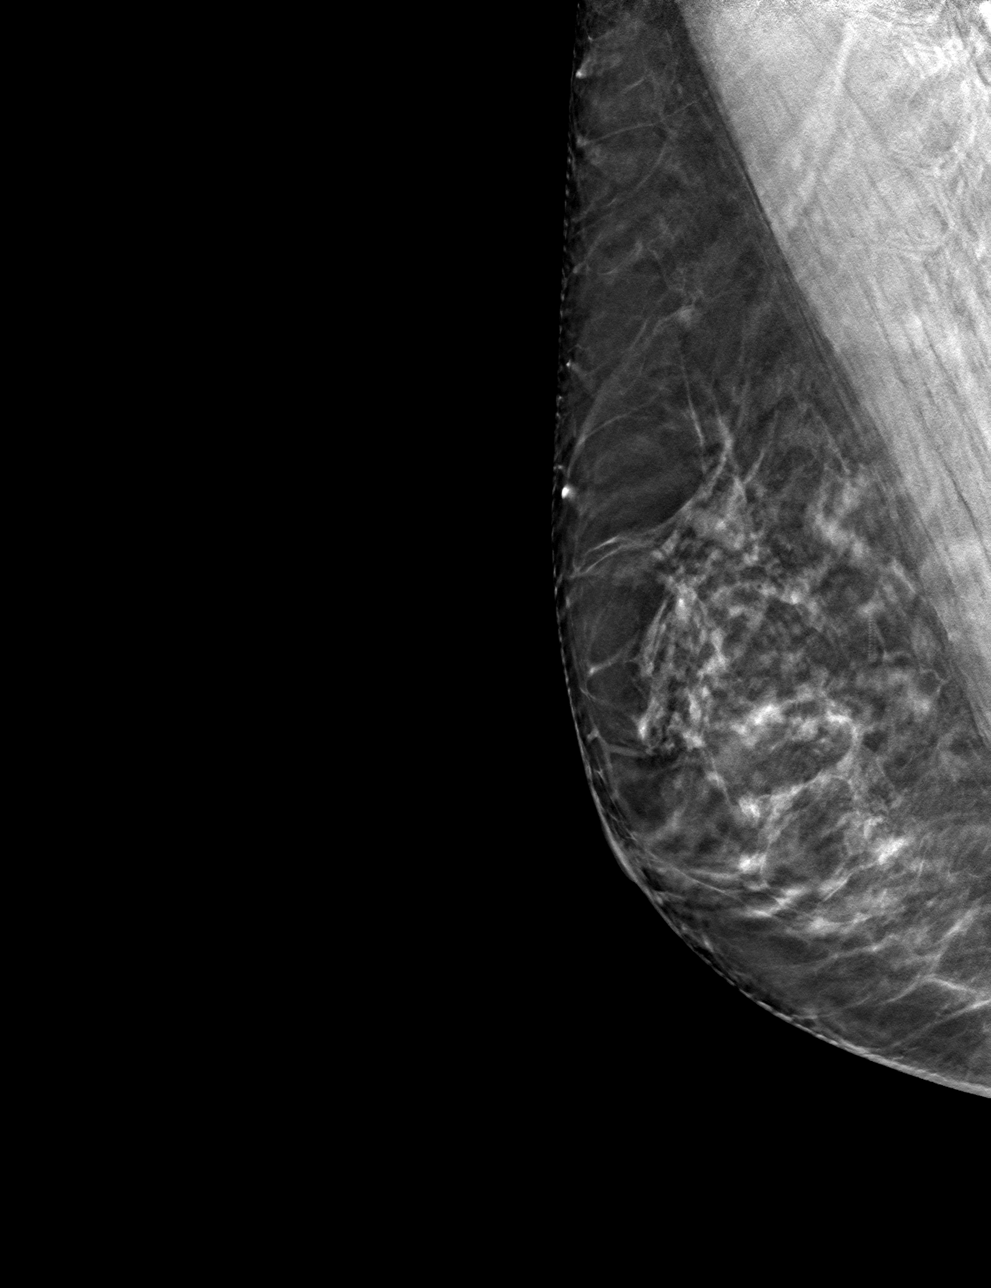

[R CC tomo · tomo slice 29/58.0]
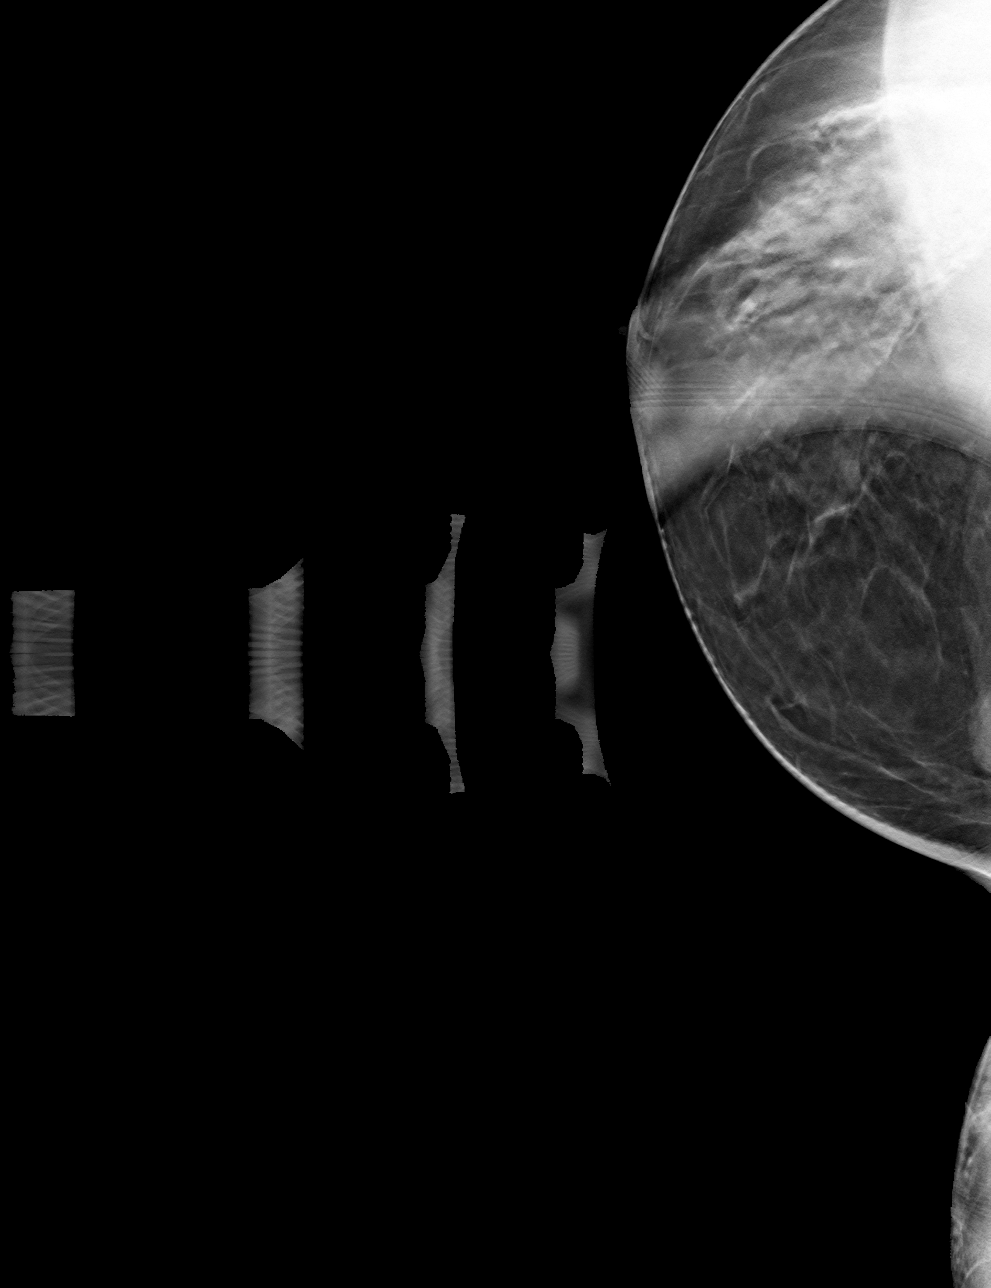

[4 of 12 positions shown; findings below may reference images not displayed]

ACR Breast Density Category b: There are scattered areas of
fibroglandular density.
FINDINGS: Additional 2-D and 3-D images are performed. These views demonstrate
persistent asymmetry in the posteromedial aspect of the RIGHT
breast, appearance consistent with muscular tissue and normal
sternalis muscle.

Mammographic images were processed with CAD.

Targeted ultrasound is performed, showing normal appearing
fibroglandular tissue throughout the MEDIAL quadrants of the RIGHT
breast. No suspicious mass, distortion, or acoustic shadowing is
demonstrated with ultrasound.
IMPRESSION: No mammographic or ultrasound evidence for malignancy.

RECOMMENDATION:
Screening mammogram in one year.(Code:[id number]-[id number])

I have discussed the findings and recommendations with the patient.
If applicable, a reminder letter will be sent to the patient
regarding the next appointment.

BI-RADS CATEGORY  1: Negative.

## 2022-09-20 ENCOUNTER — Ambulatory Visit: Payer: Managed Care, Other (non HMO) | Admitting: Cardiology

## 2022-11-01 ENCOUNTER — Ambulatory Visit: Payer: Managed Care, Other (non HMO) | Admitting: Cardiology

## 2022-11-04 ENCOUNTER — Encounter: Payer: Self-pay | Admitting: Pulmonary Disease

## 2022-11-04 ENCOUNTER — Ambulatory Visit: Payer: Managed Care, Other (non HMO) | Admitting: Pulmonary Disease

## 2022-11-04 VITALS — BP 112/80 | HR 70 | Ht 71.0 in | Wt 175.0 lb

## 2022-11-04 DIAGNOSIS — R911 Solitary pulmonary nodule: Secondary | ICD-10-CM | POA: Diagnosis not present

## 2022-11-04 NOTE — Patient Instructions (Signed)
We will get a CT scan   I think we will get good results - if needed I can arrange additional follow up  Follow up as needed

## 2022-11-04 NOTE — Progress Notes (Signed)
@Patient  ID: Pamela Poole, female    DOB: 07-Aug-1960, 62 y.o.   MRN: [id number]  Chief Complaint  Patient presents with   Consult    CT:01/07/21  lung nodule  CXR 10/11/21 Breathing is fine     Referring provider: Lucila Maine  HPI:   62 y.o. woman whom we are seeing for evaluation of lung nodule.  Note from referring provider reviewed.  Cardiology note reviewed.   Overall patient doing well.  She underwent cardiac CT 12/2020 that showed no significant calcification etc. of the coronary arteries but did demonstrate a small near the diaphragm on the right 4 mm pulmonary nodule.  Question criteria recommends no follow-up for 1 year follow-up for high risk individuals.  For unclear reasons this is fallen to the correct does not meet with him to over the last couple of years.  Had a chest x-ray last fall that showed some hyperinflation on my review of the report.  She does have a history of relative exercise intolerance and high school etc.  No significant dyspnea now.  No limitations.  She smoked in the past pack a day for a few years but quit in her early 22s.  We discussed that given her smoking history she is slightly increased risk for pulmonary nodule.  Notably the [facility] calculator puts her risk in about 3.7%.  Which is relatively low.  Discussed that recent chest x-ray with hyperinflation likely reflects relatively asymptomatic asthma.  Her relative exercise intolerance and high school plans were to etc. is a likely clue to this.  Answered questions to the best of my ability.   Questionaires / Pulmonary Flowsheets:   ACT:      No data to display          MMRC:     No data to display          Epworth:      No data to display          Tests:   FENO:  No results found for: "NITRICOXIDE"  PFT:     No data to display          WALK:      No data to display          Imaging: Personally reviewed No results found.  Lab Results: Personally  reviewed CBC No results found for: "WBC", "RBC", "HGB", "HCT", "PLT", "MCV", "MCH", "MCHC", "RDW", "LYMPHSABS", "MONOABS", "EOSABS", "BASOSABS"  BMET    Component Value Date/Time   NA 143 03/24/2022 1451   K 3.8 03/24/2022 1451   CL 101 03/24/2022 1451   CO2 26 03/24/2022 1451   GLUCOSE 99 03/24/2022 1451   BUN 22 03/24/2022 1451   CREATININE 0.76 03/24/2022 1451   CALCIUM 9.4 03/24/2022 1451    BNP No results found for: "BNP"  ProBNP No results found for: "PROBNP"  Specialty Problems   None   No Known Allergies  Immunization History  Administered Date(s) Administered   Hep A / Hep B 12/29/2011, 04/28/2012   Hepatitis A, Adult 11/21/2015   Hepatitis B, ADULT 11/21/2015   Influenza Inj Mdck Quad Pf 03/29/2019   Influenza Whole 04/21/2010, 04/28/2012, 04/14/2016, 04/04/2017   Influenza, High Dose Seasonal PF 04/04/2017   Influenza-Unspecified 04/21/2010, 04/28/2012, 04/14/2016   PFIZER(Purple Top)SARS-COV-2 Vaccination 08/25/2019, 09/19/2019, 12/06/2019, 01/14/2021   Tdap 05/26/2011   Typhoid Inactivated 11/21/2015   Zoster Recombinat (Shingrix) 01/31/2019, 04/04/2019, 05/08/2019    Past Medical History:  Diagnosis Date  High blood pressure    Multiple food allergies 04/25/2013   Apples, Cherries   MVP (mitral valve prolapse)    Mild late systolic prolapse of the posterior miltral valve leaflet with late systolic MR that is mild. The mitral valve is myxomatous. Mild mitral valve regurgitation. There is mild late systolic prolapse of the medial segment of the PMVL by echo 06/2020    Tobacco History: Social History   Tobacco Use  Smoking Status Never  Smokeless Tobacco Never   Counseling given: Not Answered   Continue to not smoke  Outpatient Encounter Medications as of 11/04/2022  Medication Sig   calcium carbonate (OS-CAL - DOSED IN MG OF ELEMENTAL CALCIUM) 1250 (500 Ca) MG tablet Take 1 tablet by mouth daily with breakfast.   EPINEPHrine (EPI-PEN) 0.3  mg/0.3 mL SOAJ injection Inject 0.3 mLs (0.3 mg total) into the muscle once.   losartan (COZAAR) 25 MG tablet Take 0.5 tablets (12.5 mg total) by mouth daily.   MULTIPLE VITAMINS PO Take 1 tablet by mouth daily.   No facility-administered encounter medications on file as of 11/04/2022.     Review of Systems  Review of Systems  No chest pain with exertion.  No orthopnea or PND.  Comprehensive review of systems otherwise negative. Physical Exam  BP 112/80 (BP Location: Left Arm, Patient Position: Sitting, Cuff Size: Normal)   Pulse 70   Ht 5\' 11"  (1.803 m)   Wt 175 lb (79.4 kg)   SpO2 97%   BMI 24.41 kg/m   Wt Readings from Last 5 Encounters:  11/04/22 175 lb (79.4 kg)  05/19/22 177 lb (80.3 kg)  03/24/22 180 lb 9.6 oz (81.9 kg)  09/24/21 180 lb (81.6 kg)  05/28/21 175 lb 9.6 oz (79.7 kg)    BMI Readings from Last 5 Encounters:  11/04/22 24.41 kg/m  05/19/22 24.69 kg/m  03/24/22 25.19 kg/m  09/24/21 25.10 kg/m  05/28/21 24.49 kg/m     Physical Exam General: Sitting in chair in no acute distress Eyes: EOMI, no icterus Neck: Supple, no JVP Pulmonary: Clear, no work of breathing Cardiovascular: Warm, no edema Abdomen: Nondistended, bowel sounds present MSK: No synovitis, no joint effusion Neuro: Normal gait, no weakness Psych: Normal mood, full affect   Assessment & Plan:   Pulmonary nodule: 4 mm near diaphragm right lower lobe.  [facility] calculator 3.7% risk.  She is a former smoker.  Low intermediate risk.  After shared decision-making agreed to repeat CT scan.  Hyperinflation on chest x-ray: Per report, flattened diaphragms etc.  Minimal smoking history.  Low suspicion for COPD or emphysema.  Relative exercise intolerance  as younger person argues for possible asthma, longstanding with chronic changes seen on chest x-ray.  She has no significant symptoms.  No further workup.   Return if symptoms worsen or fail to improve.   Karren Burly,  MD 11/04/2022   This appointment required 60 minutes of patient care (this includes precharting, chart review, review of results, face-to-face care, etc.).

## 2022-12-10 ENCOUNTER — Ambulatory Visit
Admission: RE | Admit: 2022-12-10 | Discharge: 2022-12-10 | Disposition: A | Discharge status: Home / Self Care | Payer: Managed Care, Other (non HMO) | Source: Ambulatory Visit | Attending: Pulmonary Disease | Admitting: Pulmonary Disease

## 2022-12-10 DIAGNOSIS — R911 Solitary pulmonary nodule: Secondary | ICD-10-CM

## 2022-12-13 ENCOUNTER — Telehealth: Payer: Self-pay | Admitting: Pulmonary Disease

## 2022-12-13 NOTE — Telephone Encounter (Signed)
Person presented to the front with a disc. We placed it in the designated disc "basket". 

## 2023-01-25 ENCOUNTER — Other Ambulatory Visit: Payer: Self-pay | Admitting: General Practice

## 2023-02-21 ENCOUNTER — Encounter: Payer: Self-pay | Admitting: Pulmonary Disease

## 2023-02-21 ENCOUNTER — Ambulatory Visit: Payer: Managed Care, Other (non HMO) | Admitting: Pulmonary Disease

## 2023-02-21 VITALS — BP 138/80 | HR 77 | Ht 71.0 in | Wt 173.4 lb

## 2023-02-21 DIAGNOSIS — J432 Centrilobular emphysema: Secondary | ICD-10-CM

## 2023-02-21 NOTE — Patient Instructions (Signed)
Let's do blood work today out of an abundance caution for alpha 1 anti-trypsin. I think it will be normal but let's make sure.

## 2023-02-21 NOTE — Progress Notes (Signed)
@Patient  ID: Pamela Poole, female    DOB: 09/06/1960, 62 y.o.   MRN: [id number]  Chief Complaint  Patient presents with   Follow-up    Pt is here to discuss CT results. Ct was performed on 06/ 14 2024.    Referring provider: Lucila Maine  HPI:   62 y.o. woman whom we are seeing for evaluation of lung nodule and emphysema.  Overall doing quite well.  Had interval CT scan performed 11/2022 for small nodule seen on cardiac CT 12/2020, delayed 1 year follow-up that was recommended at that time.  Nodule stable, no further follow-up needed per Fleischner criteria.  There is comment on emphysema on the CT read.  On my review interpretation there is mild emphysema primarily at the upper lobes near apices.  There is a larger hypodense area on the right that looks appear to have normal architecture, blood vessels etc. within the left favored to reflect an area of mosaicism or air trapping.  We discussed at length the implication of emphysema etc.  The differential diagnoses or causes of emphysema.  Reviewed CT scan in detail.  HPI at initial visit: Overall patient doing well.  She underwent cardiac CT 12/2020 that showed no significant calcification etc. of the coronary arteries but did demonstrate a small near the diaphragm on the right 4 mm pulmonary nodule.  Question criteria recommends no follow-up for 1 year follow-up for high risk individuals.  For unclear reasons this is fallen to the correct does not meet with him to over the last couple of years.  Had a chest x-ray last fall that showed some hyperinflation on my review of the report.  She does have a history of relative exercise intolerance and high school etc.  No significant dyspnea now.  No limitations.  She smoked in the past pack a day for a few years but quit in her early 68s.  We discussed that given her smoking history she is slightly increased risk for pulmonary nodule.  Notably the [facility] calculator puts her risk in about  3.7%.  Which is relatively low.  Discussed that recent chest x-ray with hyperinflation likely reflects relatively asymptomatic asthma.  Her relative exercise intolerance and high school plans were to etc. is a likely clue to this.  Answered questions to the best of my ability.   Questionaires / Pulmonary Flowsheets:   ACT:      No data to display          MMRC:     No data to display          Epworth:      No data to display          Tests:   FENO:  No results found for: "NITRICOXIDE"  PFT:     No data to display          WALK:      No data to display          Imaging: Personally reviewed No results found.  Lab Results: Personally reviewed CBC No results found for: "WBC", "RBC", "HGB", "HCT", "PLT", "MCV", "MCH", "MCHC", "RDW", "LYMPHSABS", "MONOABS", "EOSABS", "BASOSABS"  BMET    Component Value Date/Time   NA 143 03/24/2022 1451   K 3.8 03/24/2022 1451   CL 101 03/24/2022 1451   CO2 26 03/24/2022 1451   GLUCOSE 99 03/24/2022 1451   BUN 22 03/24/2022 1451   CREATININE 0.76 03/24/2022 1451   CALCIUM 9.4 03/24/2022 1451  BNP No results found for: "BNP"  ProBNP No results found for: "PROBNP"  Specialty Problems   None   No Known Allergies  Immunization History  Administered Date(s) Administered   Hep A / Hep B 12/29/2011, 04/28/2012   Hepatitis A, Adult 11/21/2015   Hepatitis B, ADULT 11/21/2015   Influenza Inj Mdck Quad Pf 03/29/2019   Influenza Whole 04/21/2010, 04/28/2012, 04/14/2016, 04/04/2017   Influenza, High Dose Seasonal PF 04/04/2017   Influenza-Unspecified 04/21/2010, 04/28/2012, 04/14/2016   PFIZER(Purple Top)SARS-COV-2 Vaccination 08/25/2019, 09/19/2019, 12/06/2019, 06/01/2020, 01/14/2021   Tdap 05/26/2011   Typhoid Inactivated 11/21/2015   Zoster Recombinant(Shingrix) 01/31/2019, 04/04/2019, 05/08/2019    Past Medical History:  Diagnosis Date   High blood pressure    Multiple food allergies 04/25/2013    Apples, Cherries   MVP (mitral valve prolapse)    Mild late systolic prolapse of the posterior miltral valve leaflet with late systolic MR that is mild. The mitral valve is myxomatous. Mild mitral valve regurgitation. There is mild late systolic prolapse of the medial segment of the PMVL by echo 06/2020    Tobacco History: Social History   Tobacco Use  Smoking Status Never  Smokeless Tobacco Never   Counseling given: Not Answered   Continue to not smoke  Outpatient Encounter Medications as of 02/21/2023  Medication Sig   calcium carbonate (OS-CAL - DOSED IN MG OF ELEMENTAL CALCIUM) 1250 (500 Ca) MG tablet Take 1 tablet by mouth daily with breakfast.   EPINEPHrine (EPI-PEN) 0.3 mg/0.3 mL SOAJ injection Inject 0.3 mLs (0.3 mg total) into the muscle once.   losartan (COZAAR) 25 MG tablet Take 0.5 tablets (12.5 mg total) by mouth daily.   MULTIPLE VITAMINS PO Take 1 tablet by mouth daily.   No facility-administered encounter medications on file as of 02/21/2023.     Review of Systems  Review of Systems  N/a Physical Exam  BP 138/80 (BP Location: Left Arm, Cuff Size: Normal)   Pulse 77   Ht 5\' 11"  (1.803 m)   Wt 173 lb 6.4 oz (78.7 kg)   SpO2 97%   BMI 24.18 kg/m   Wt Readings from Last 5 Encounters:  02/21/23 173 lb 6.4 oz (78.7 kg)  11/04/22 175 lb (79.4 kg)  05/19/22 177 lb (80.3 kg)  03/24/22 180 lb 9.6 oz (81.9 kg)  09/24/21 180 lb (81.6 kg)    BMI Readings from Last 5 Encounters:  02/21/23 24.18 kg/m  11/04/22 24.41 kg/m  05/19/22 24.69 kg/m  03/24/22 25.19 kg/m  09/24/21 25.10 kg/m     Physical Exam General: Sitting in chair in no acute distress Eyes: EOMI, no icterus Neck: Supple, no JVP Pulmonary: Clear, no work of breathing Cardiovascular: Warm, no edema Abdomen: Nondistended, bowel sounds present MSK: No synovitis, no joint effusion Neuro: Normal gait, no weakness Psych: Normal mood, full affect   Assessment & Plan:   Pulmonary  nodule: Demonstrated 12/2020, stable 11/2022.  No further follow-up needed per Fleischner criteria.  Hyperinflation on chest x-ray: Per report, flattened diaphragms etc.  Minimal smoking history.  Low suspicion for COPD.  Mild emphysematous changes on CT scan.  Relative exercise intolerance  as younger person argues for possible asthma, longstanding with chronic changes seen on chest x-ray.  She has no significant symptoms.    Emphysema: Minimal smoking history in the past.  Alpha-1 antitrypsin ordered today for completeness.  If normal, no follow-up or additional evaluation needed.  Did discuss implications for progeny as well as brief discussion about treatment if  needed in the future.   Return if symptoms worsen or fail to improve.   Karren Burly, MD 02/21/2023   This appointment required 41 minutes of patient care (this includes precharting, chart review, review of results, face-to-face care, etc.).

## 2023-03-01 LAB — ALPHA-1 ANTITRYPSIN PHENOTYPE: A-1 Antitrypsin, Ser: 140 mg/dL (ref 83–199)

## 2023-04-06 ENCOUNTER — Ambulatory Visit: Payer: Managed Care, Other (non HMO) | Admitting: Cardiology

## 2023-04-27 ENCOUNTER — Telehealth: Payer: Self-pay | Admitting: Cardiology

## 2023-04-27 MED ORDER — LOSARTAN POTASSIUM 25 MG PO TABS: 12.5000 mg | ORAL_TABLET | Freq: Every day | ORAL | 1 refills | Status: DC

## 2023-04-27 NOTE — Telephone Encounter (Signed)
*  STAT* If patient is at the pharmacy, call can be transferred to refill team.   1. Which medications need to be refilled? (please list name of each medication and dose if known)   losartan (COZAAR) 25 MG tablet    2. Which pharmacy/location (including street and city if local pharmacy) is medication to be sent to?[id number] - [place], [place] - [address]   3. Do they need a 30 day or 90 day supply? 90 day

## 2023-04-27 NOTE — Telephone Encounter (Signed)
Pt's medication was sent to pt's pharmacy as requested. Confirmation received.  °

## 2023-08-04 ENCOUNTER — Ambulatory Visit: Payer: Managed Care, Other (non HMO) | Admitting: Cardiology

## 2023-09-22 ENCOUNTER — Telehealth: Payer: Self-pay | Admitting: Cardiology

## 2023-09-22 NOTE — Telephone Encounter (Signed)
 09/22/23 LVM to r/s appt on 04/02 with Dr. Mayford Knife due to a change in her schedule - LCN

## 2023-09-26 ENCOUNTER — Telehealth: Payer: Self-pay | Admitting: Cardiology

## 2023-09-26 NOTE — Telephone Encounter (Signed)
 Pt would like to switch from Dr Mayford Knife to Dr Cristal Deer. Please Advise

## 2023-09-28 ENCOUNTER — Ambulatory Visit: Payer: Managed Care, Other (non HMO) | Admitting: Cardiology

## 2023-10-05 ENCOUNTER — Other Ambulatory Visit (HOSPITAL_BASED_OUTPATIENT_CLINIC_OR_DEPARTMENT_OTHER): Payer: Self-pay | Admitting: Obstetrics and Gynecology

## 2023-10-05 DIAGNOSIS — E2839 Other primary ovarian failure: Secondary | ICD-10-CM

## 2024-02-17 ENCOUNTER — Ambulatory Visit (HOSPITAL_BASED_OUTPATIENT_CLINIC_OR_DEPARTMENT_OTHER): Admitting: Cardiology

## 2024-03-30 ENCOUNTER — Other Ambulatory Visit: Payer: Self-pay | Admitting: Cardiology

## 2024-03-30 MED ORDER — LOSARTAN POTASSIUM 25 MG PO TABS: 12.5000 mg | ORAL_TABLET | Freq: Every day | ORAL | 0 refills | Status: DC

## 2024-04-02 ENCOUNTER — Ambulatory Visit (HOSPITAL_BASED_OUTPATIENT_CLINIC_OR_DEPARTMENT_OTHER): Admitting: Cardiology

## 2024-06-12 ENCOUNTER — Encounter (HOSPITAL_BASED_OUTPATIENT_CLINIC_OR_DEPARTMENT_OTHER): Payer: Self-pay | Admitting: Cardiology

## 2024-06-12 ENCOUNTER — Ambulatory Visit (HOSPITAL_BASED_OUTPATIENT_CLINIC_OR_DEPARTMENT_OTHER): Admitting: Cardiology

## 2024-06-12 VITALS — BP 138/88 | HR 63 | Ht 71.0 in | Wt 180.7 lb

## 2024-06-12 DIAGNOSIS — Z7182 Exercise counseling: Secondary | ICD-10-CM

## 2024-06-12 DIAGNOSIS — Z7189 Other specified counseling: Secondary | ICD-10-CM

## 2024-06-12 DIAGNOSIS — I10 Essential (primary) hypertension: Secondary | ICD-10-CM

## 2024-06-12 DIAGNOSIS — Z713 Dietary counseling and surveillance: Secondary | ICD-10-CM

## 2024-06-12 DIAGNOSIS — Z87898 Personal history of other specified conditions: Secondary | ICD-10-CM

## 2024-06-12 DIAGNOSIS — Z8249 Family history of ischemic heart disease and other diseases of the circulatory system: Secondary | ICD-10-CM

## 2024-06-12 NOTE — Patient Instructions (Addendum)
 Recent data, summarized from NPR from a study from the [facility]:  cropwizard.com.pt  Researchers at the [facility] set out to answer this question by comparing statins to supplements in a clinical trial. They tracked the outcomes of 190 adults, ages 64 to 79. Some participants were given a 5 mg daily dose of rosuvastatin, a statin that is sold under the brand name Crestor for 28 days. Others were given supplements, including fish oil, cinnamon, garlic, turmeric, plant sterols or red yeast rice for the same period.  What we found was that rosuvastatin lowered LDL cholesterol by almost 61% and that was vastly superior to placebo and any of the six supplements studied in the trial, study dino Herlene Hood, M.D. of the [facility] told NPR. He says this level of reduction is enough to lower the risk of heart attacks and strokes. The findings are published in the Journal of the [employer] of Cardiology.   FOLLOW UP IN ONE YEAR  Oftentimes these supplements are marketed as 'natural ways' to lower your cholesterol, says Laffin. But he says none of the dietary supplements demonstrated any significant decrease in LDL cholesterol compared with a placebo. LDL cholesterol is considered the 'bad cholesterol' because it can contribute to plaque build-up in the artery walls - which can narrow the arteries, and set the stage for heart attacks and strokes   how to check blood pressure:  -sit comfortably in a chair, feet uncrossed and flat on floor, for 5-10 minutes  -arm ideally should rest at the level of the heart. However, arm should be relaxed and not tense (for example, do not hold the arm up unsupported)  -avoid exercise, caffeine, and tobacco for at least 30 minutes prior to BP reading  -don't take BP cuff reading  over clothes (always place on skin directly)  -I prefer to know how well the medication is working, so I would like you to take your readings 1-2 hours after taking your blood pressure medication if possible   Let me know if consistently more than 130/80

## 2024-06-12 NOTE — Progress Notes (Signed)
 Cardiology Office Note:  .   Date:  06/12/2024  ID:  Pamela Poole, DOB [birth year], MRN [id number] PCP: Jacques Camie Pepper, PA-C  [facility] Providers Cardiologist:  Shelda Bruckner, MD {  History of Present Illness: .   Pamela Poole is a 63 y.o. female with PMH chest pain, hypertension, OSA. She was previously followed by Dr. Shlomo and established care with me on 06/12/24.   Pertinent CV history: Initially seen for chest pain 04/2020, had coronary CT with calcium score of 0 and no CAD. Echo with preserved EF, mild MVP. Seen again for chest pain by Josefa Beauvais 02/2022, also noted palpitations, monitor ordered which showed predominantly SR, 12 beats of idioventricular rhythm, PVC burden 1%, brief SVT up to 18 beats.  Today: Has a strong family history of heart disease on her father's side, so has always been vigilant with her heart. Reviewed her workup to date.  Has had high blood pressure since about 2019. Initially started on hydrochlorothiazide 12.5 mg. Changed to losartan  04/2022, flutters she was having went away. She does note that for a time, the prescription for her losartan  was filled as 25 mg daily instead of 12.5 mg, which she tolerated, and then prescription returned to 12.5 mg dose. She tolerated the 25 mg dose without issues. Does not check BP at home.   Walks without issue, wants to increase activity. Discussed recommendations on this. Diet is good, very little red meat, lots of fruits and vegetables, lean protein. Discussed lifestyle recommendations at length, supplements.   Asking about increasing energy. Asking about HRT. Went through menopause about age 78. Minimal symptoms with this. Sleeps well.   ROS: Denies chest pain, shortness of breath at rest or with normal exertion. No PND, orthopnea, LE edema or unexpected weight gain. No syncope or palpitations. ROS otherwise negative except as noted.   Studies Reviewed: SABRA    EKG:  EKG  Interpretation Date/Time:  Tuesday June 12 2024 16:41:59 EST Ventricular Rate:  62 PR Interval:  162 QRS Duration:  86 QT Interval:  424 QTC Calculation: 430 R Axis:   74  Text Interpretation: Normal sinus rhythm Normal ECG Confirmed by Bruckner Shelda [phone]) on 06/12/2024 5:24:16 PM    Physical Exam:   VS:  BP 138/88 (Cuff Size: Normal)   Pulse 63   Ht 5' 11 (1.803 m)   Wt 180 lb 11.2 oz (82 kg)   SpO2 94%   BMI 25.20 kg/m    Wt Readings from Last 3 Encounters:  06/12/24 180 lb 11.2 oz (82 kg)  02/21/23 173 lb 6.4 oz (78.7 kg)  11/04/22 175 lb (79.4 kg)    GEN: Well nourished, well developed in no acute distress HEENT: Normal, moist mucous membranes NECK: No JVD CARDIAC: regular rhythm, normal S1 and S2, no rubs or gallops. No murmur. VASCULAR: Radial and DP pulses 2+ bilaterally. No carotid bruits RESPIRATORY:  Clear to auscultation without rales, wheezing or rhonchi  ABDOMEN: Soft, non-tender, non-distended MUSCULOSKELETAL:  Ambulates independently SKIN: Warm and dry, no edema NEUROLOGIC:  Alert and oriented x 3. No focal neuro deficits noted. PSYCHIATRIC:  Normal affect    ASSESSMENT AND PLAN: .    Hypertension -improved on recheck -reviewed how to check, goal of <130/80 -continue losartan  12.5 mg daily  Family history of heart disease Personal history of noncardiac chest pain -reviewed reassuring prior workup -reviewed recommendations, data on supplements -discussed exercise and diet recommendations, as below  OSA -uses appliance  CV risk  counseling and prevention -recommend heart healthy/Mediterranean diet, with whole grains, fruits, vegetable, fish, lean meats, nuts, and olive oil. Limit salt. -recommend moderate walking, 3-5 times/week for 30-50 minutes each session. Aim for at least 150 minutes/week. Goal should be pace of 3 miles/hours, or walking 1.5 miles in 30 minutes -recommend avoidance of tobacco products. Avoid excess  alcohol. -ASCVD risk score: The 10-year ASCVD risk score (Arnett DK, et al., 2019) is: 4.5%   Values used to calculate the score:     Age: 14 years     Clinically relevant sex: Female     Is Non-Hispanic African American: No     Diabetic: No     Tobacco smoker: No     Systolic Blood Pressure: 138 mmHg     Is BP treated: Yes     HDL Cholesterol: 71 mg/dL     Total Cholesterol: 178 mg/dL    Dispo: 1 year or sooner as needed  Total time of encounter: I spent 40 minutes dedicated to the care of this patient on the date of this encounter to include pre-visit review of records, face-to-face time with the patient discussing conditions above, and clinical documentation with the electronic health record. We specifically spent time today discussing risk, supplements, guidelines, diet, exercise as noted above.  Signed, Shelda Bruckner, MD   Shelda Bruckner, MD, PhD, [facility]  [facility]  [place]  [facility] at [facility] [address], [address] [place], [place] [address] [phone]

## 2024-06-22 ENCOUNTER — Encounter (HOSPITAL_BASED_OUTPATIENT_CLINIC_OR_DEPARTMENT_OTHER): Payer: Self-pay | Admitting: Cardiology

## 2024-07-10 ENCOUNTER — Other Ambulatory Visit: Payer: Self-pay | Admitting: General Practice

## 2024-07-11 ENCOUNTER — Telehealth: Payer: Self-pay | Admitting: Cardiology

## 2024-07-11 NOTE — Telephone Encounter (Signed)
" °*  STAT* If patient is at the pharmacy, call can be transferred to refill team.   1. Which medications need to be refilled? (please list name of each medication and dose if known) losartan  (COZAAR ) 25 MG tablet   2. Which pharmacy/location (including street and city if local pharmacy) is medication to be sent to?   losartan  (COZAAR ) 25 MG tablet    3. Do they need a 30 day or 90 day supply? 90  "

## 2024-07-18 NOTE — Telephone Encounter (Signed)
 Refill sent 07/10/24

## 2024-08-15 ENCOUNTER — Other Ambulatory Visit (HOSPITAL_BASED_OUTPATIENT_CLINIC_OR_DEPARTMENT_OTHER)
